# Patient Record
Sex: Male | Born: 1990
Health system: Southern US, Community
[De-identification: ages and names within clinical notes are randomized; demographics above are authoritative.]

---
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
## 2010-07-28 ENCOUNTER — Emergency Department: Payer: Self-pay | Admitting: Emergency Medicine

## 2012-07-01 ENCOUNTER — Emergency Department: Payer: Self-pay | Admitting: Emergency Medicine

## 2012-07-02 ENCOUNTER — Emergency Department: Payer: Self-pay | Admitting: Internal Medicine

## 2012-07-18 ENCOUNTER — Emergency Department: Payer: Self-pay | Admitting: Emergency Medicine

## 2014-04-23 ENCOUNTER — Emergency Department: Payer: Self-pay | Admitting: Emergency Medicine

## 2014-04-23 LAB — CBC WITH DIFFERENTIAL/PLATELET
BASOS PCT: 0.2 %
Basophil #: 0 10*3/uL (ref 0.0–0.1)
EOS PCT: 0.4 %
Eosinophil #: 0 10*3/uL (ref 0.0–0.7)
HCT: 51.7 % (ref 40.0–52.0)
HGB: 17.2 g/dL (ref 13.0–18.0)
LYMPHS ABS: 0.8 10*3/uL — AB (ref 1.0–3.6)
Lymphocyte %: 8.2 %
MCH: 29.4 pg (ref 26.0–34.0)
MCHC: 33.3 g/dL (ref 32.0–36.0)
MCV: 88 fL (ref 80–100)
MONO ABS: 1.4 x10 3/mm — AB (ref 0.2–1.0)
Monocyte %: 14.3 %
NEUTROS PCT: 76.9 %
Neutrophil #: 7.4 10*3/uL — ABNORMAL HIGH (ref 1.4–6.5)
Platelet: 204 10*3/uL (ref 150–440)
RBC: 5.86 10*6/uL (ref 4.40–5.90)
RDW: 12.5 % (ref 11.5–14.5)
WBC: 9.6 10*3/uL (ref 3.8–10.6)

## 2014-04-23 LAB — COMPREHENSIVE METABOLIC PANEL
ALBUMIN: 5 g/dL
ALK PHOS: 72 U/L
Anion Gap: 10 (ref 7–16)
BILIRUBIN TOTAL: 1.8 mg/dL — AB
BUN: 16 mg/dL
CALCIUM: 9.1 mg/dL
CO2: 25 mmol/L
Chloride: 103 mmol/L
Creatinine: 1.04 mg/dL
EGFR (African American): >60
EGFR (Non-African Amer.): >60
Glucose: 89 mg/dL
Potassium: 3.9 mmol/L
SGOT(AST): 26 U/L
SGPT (ALT): 16 U/L — ABNORMAL LOW
SODIUM: 138 mmol/L
Total Protein: 8 g/dL

## 2014-04-23 LAB — URINALYSIS, COMPLETE
BILIRUBIN, UR: NEGATIVE
Bacteria: NONE SEEN
Blood: NEGATIVE
Glucose,UR: NEGATIVE mg/dL (ref 0–75)
LEUKOCYTE ESTERASE: NEGATIVE
Nitrite: NEGATIVE
PH: 5 (ref 4.5–8.0)
Protein: 30
RBC,UR: NONE SEEN /HPF (ref 0–5)
SQUAMOUS EPITHELIAL: NONE SEEN
Specific Gravity: 1.032 (ref 1.003–1.030)
WBC UR: 2 /HPF (ref 0–5)

## 2014-04-23 LAB — LIPASE, BLOOD: Lipase: 36 U/L

## 2014-07-23 ENCOUNTER — Emergency Department: Payer: No Typology Code available for payment source

## 2014-07-23 ENCOUNTER — Emergency Department
Admission: EM | Admit: 2014-07-23 | Discharge: 2014-07-23 | Disposition: A | Discharge status: Home / Self Care | Payer: No Typology Code available for payment source | Attending: Emergency Medicine | Admitting: Emergency Medicine

## 2014-07-23 ENCOUNTER — Encounter: Payer: Self-pay | Admitting: Emergency Medicine

## 2014-07-23 DIAGNOSIS — S40012A Contusion of left shoulder, initial encounter: Secondary | ICD-10-CM

## 2014-07-23 DIAGNOSIS — S60511A Abrasion of right hand, initial encounter: Secondary | ICD-10-CM | POA: Diagnosis not present

## 2014-07-23 DIAGNOSIS — Z23 Encounter for immunization: Secondary | ICD-10-CM | POA: Insufficient documentation

## 2014-07-23 DIAGNOSIS — Y92410 Unspecified street and highway as the place of occurrence of the external cause: Secondary | ICD-10-CM | POA: Diagnosis not present

## 2014-07-23 DIAGNOSIS — Y998 Other external cause status: Secondary | ICD-10-CM | POA: Diagnosis not present

## 2014-07-23 DIAGNOSIS — S4992XA Unspecified injury of left shoulder and upper arm, initial encounter: Secondary | ICD-10-CM | POA: Diagnosis present

## 2014-07-23 DIAGNOSIS — Y9389 Activity, other specified: Secondary | ICD-10-CM | POA: Diagnosis not present

## 2014-07-23 DIAGNOSIS — S0990XA Unspecified injury of head, initial encounter: Secondary | ICD-10-CM | POA: Diagnosis not present

## 2014-07-23 MED ORDER — OXYCODONE-ACETAMINOPHEN 5-325 MG PO TABS: 1.0000 | ORAL_TABLET | ORAL | Status: DC | PRN

## 2014-07-23 MED ORDER — ONDANSETRON 4 MG PO TBDP
4.0000 mg | ORAL_TABLET | Freq: Once | ORAL | Status: AC
  Administered 2014-07-23: 4 mg via ORAL

## 2014-07-23 MED ORDER — OXYCODONE-ACETAMINOPHEN 5-325 MG PO TABS
ORAL_TABLET | ORAL | Status: AC
Start: 2014-07-23 — End: 2014-07-23
  Administered 2014-07-23: 1 via ORAL
  Filled 2014-07-23: qty 1

## 2014-07-23 MED ORDER — TETANUS-DIPHTHERIA TOXOIDS TD 5-2 LFU IM INJ
INJECTION | INTRAMUSCULAR | Status: AC
  Filled 2014-07-23: qty 0.5

## 2014-07-23 MED ORDER — IBUPROFEN 800 MG PO TABS: 800.0000 mg | ORAL_TABLET | Freq: Three times a day (TID) | ORAL | Status: DC | PRN

## 2014-07-23 MED ORDER — ONDANSETRON 4 MG PO TBDP
ORAL_TABLET | ORAL | Status: AC
  Filled 2014-07-23: qty 1

## 2014-07-23 MED ORDER — TETANUS-DIPHTH-ACELL PERTUSSIS 5-2.5-18.5 LF-MCG/0.5 IM SUSP
INTRAMUSCULAR | Status: AC
  Administered 2014-07-23: 0.5 mL via INTRAMUSCULAR
  Filled 2014-07-23: qty 0.5

## 2014-07-23 MED ORDER — DIPHTH-ACELL PERTUSSIS-TETANUS 25-58-10 LF-MCG/0.5 IM SUSP: 0.5000 mL | Freq: Once | INTRAMUSCULAR | Status: DC

## 2014-07-23 MED ORDER — OXYCODONE-ACETAMINOPHEN 5-325 MG PO TABS
1.0000 | ORAL_TABLET | Freq: Once | ORAL | Status: AC
  Administered 2014-07-23: 1 via ORAL
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
## 2014-07-23 NOTE — ED Notes (Signed)
Patient transported to CT 
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
## 2014-07-27 ENCOUNTER — Encounter: Payer: Self-pay | Admitting: General Practice

## 2014-07-27 ENCOUNTER — Emergency Department
Admission: EM | Admit: 2014-07-27 | Discharge: 2014-07-27 | Disposition: A | Discharge status: Home / Self Care | Payer: No Typology Code available for payment source | Attending: Emergency Medicine | Admitting: Emergency Medicine

## 2014-07-27 DIAGNOSIS — M25512 Pain in left shoulder: Secondary | ICD-10-CM | POA: Insufficient documentation

## 2014-07-27 DIAGNOSIS — M79602 Pain in left arm: Secondary | ICD-10-CM

## 2014-07-27 DIAGNOSIS — M545 Low back pain, unspecified: Secondary | ICD-10-CM

## 2014-07-27 MED ORDER — CYCLOBENZAPRINE HCL 10 MG PO TABS: 10.0000 mg | ORAL_TABLET | Freq: Three times a day (TID) | ORAL | Status: DC | PRN

## 2014-07-27 NOTE — ED Notes (Signed)
See provider notes for full assessment 

## 2014-07-27 NOTE — Discharge Instructions (Signed)
If you continue to have pain, you will need to follow up with the orthopedic doctor.
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
## 2015-05-28 ENCOUNTER — Encounter: Payer: Self-pay | Admitting: Emergency Medicine

## 2015-05-28 ENCOUNTER — Emergency Department
Admission: EM | Admit: 2015-05-28 | Discharge: 2015-05-28 | Disposition: A | Discharge status: Home / Self Care | Payer: No Typology Code available for payment source | Attending: Emergency Medicine | Admitting: Emergency Medicine

## 2015-05-28 DIAGNOSIS — J069 Acute upper respiratory infection, unspecified: Secondary | ICD-10-CM

## 2015-05-28 DIAGNOSIS — N390 Urinary tract infection, site not specified: Secondary | ICD-10-CM | POA: Insufficient documentation

## 2015-05-28 MED ORDER — FLUTICASONE PROPIONATE 50 MCG/ACT NA SUSP: 2.0000 | Freq: Every day | NASAL | Status: DC

## 2015-05-28 MED ORDER — CETIRIZINE HCL 5 MG PO TABS: 5.0000 mg | ORAL_TABLET | Freq: Every day | ORAL | Status: DC

## 2015-05-28 MED ORDER — AZITHROMYCIN 250 MG PO TABS: ORAL_TABLET | ORAL | Status: DC

## 2015-05-28 MED ORDER — BENZONATATE 100 MG PO CAPS: 100.0000 mg | ORAL_CAPSULE | Freq: Three times a day (TID) | ORAL | Status: DC | PRN

## 2015-05-28 NOTE — ED Notes (Signed)
Discussed discharge instructions, prescriptions, and follow-up care with patient. No questions or concerns at this time. Pt stable at discharge.  

## 2015-05-28 NOTE — ED Provider Notes (Signed)
[facility] Emergency Department Provider Note ____________________________________________  Time seen: 0719  I have reviewed the triage vital signs and the nursing notes.  HISTORY  Chief Complaint  Cough and Sore Throat  HPI Alexander Baird is a 25 y.o. male presents to the ED for evaluation of 3 day history of cough, sore throat, and intermittent fevers. The patient reports taking an over-the-counter cough and cold medicine yesterday, and today presents to the ED without any medications on board.He also notes subjective fevers, chills, and productive cough. He describes central chest wall pain and tightness associated with his cough. He did not receive the seasonal flu vaccine.   History reviewed. No pertinent past medical history.  There are no active problems to display for this patient.   History reviewed. No pertinent past surgical history.  Current Outpatient Rx  Name  Route  Sig  Dispense  Refill  . azithromycin (ZITHROMAX Z-PAK) 250 MG tablet      Take 2 tablets (500 mg) on  Day 1,  followed by 1 tablet (250 mg) once daily on Days 2 through 5.   6 each   0   . benzonatate (TESSALON PERLES) 100 MG capsule   Oral   Take 1 capsule (100 mg total) by mouth 3 (three) times daily as needed for cough (Take 1-2 per dose).   30 capsule   0   . cetirizine (ZYRTEC) 5 MG tablet   Oral   Take 1 tablet (5 mg total) by mouth daily.   30 tablet   0   . cyclobenzaprine (FLEXERIL) 10 MG tablet   Oral   Take 1 tablet (10 mg total) by mouth 3 (three) times daily as needed for muscle spasms.   30 tablet   0   . fluticasone (FLONASE) 50 MCG/ACT nasal spray   Each Nare   Place 2 sprays into both nostrils daily.   16 g   0   . ibuprofen (ADVIL,MOTRIN) 800 MG tablet   Oral   Take 1 tablet (800 mg total) by mouth every 8 (eight) hours as needed for moderate pain.   15 tablet   0   . oxyCODONE-acetaminophen (ROXICET) 5-325 MG per tablet   Oral   Take 1  tablet by mouth every 4 (four) hours as needed for severe pain.   15 tablet   0    Allergies Review of patient's allergies indicates no known allergies.  History reviewed. No pertinent family history.  Social History Social History  Substance Use Topics  . Smoking status: Never Smoker   . Smokeless tobacco: None  . Alcohol Use: No   Review of Systems  Constitutional: Positive for subjective fevers and bodyaches. Eyes: Negative for visual changes. ENT: Positive for sore throat. Reports nasal congestion Cardiovascular: Negative for chest pain. Respiratory: Negative for shortness of breath. Reports productive cough Gastrointestinal: Negative for abdominal pain, vomiting and diarrhea. Musculoskeletal: Negative for back pain. Skin: Negative for rash. ____________________________________________  PHYSICAL EXAM:  VITAL SIGNS: ED Triage Vitals  Enc Vitals Group     BP 05/28/15 0708 113/72 mmHg     Pulse Rate 05/28/15 0708 90     Resp 05/28/15 0708 18     Temp 05/28/15 0708 98.8 F (37.1 C)     Temp Source 05/28/15 0708 Oral     SpO2 05/28/15 0708 97 %     Weight 05/28/15 0708 160 lb (72.576 kg)     Height 05/28/15 0708  (1.88 m)  Head Cir --      Peak Flow --      Pain Score --      Pain Loc --      Pain Edu? --      Excl. in GC? --    Constitutional: Alert and oriented. Well appearing and in no distress. Head: Normocephalic and atraumatic.      Eyes: Conjunctivae are normal. PERRL. Normal extraocular movements      Ears: Canals clear. TMs intact bilaterally.   Nose: No congestion/rhinorrhea. Mild maxillary sinus tenderness on palpation.   Mouth/Throat: Mucous membranes are moist. Uvula midline. Tonsils flat, without erythema or exudates   Neck: Supple. No thyromegaly. Hematological/Lymphatic/Immunological: No cervical lymphadenopathy. Cardiovascular: Normal rate, regular rhythm.  Respiratory: Normal respiratory effort. No wheezes/rales/rhonchi.  Intermittent cough during exam. Gastrointestinal: Soft and nontender. No distention. Musculoskeletal: Nontender with normal range of motion in all extremities.  Neurologic:  Normal gait without ataxia. Normal speech and language. No gross focal neurologic deficits are appreciated. Skin:  Skin is warm, dry and intact. No rash noted. ____________________________________________  INITIAL IMPRESSION / ASSESSMENT AND PLAN / ED COURSE  Patient with symptoms likely representing a viral URI. He will be discharged with prescriptions for Flonase, cetirizine, Tessalon Perles, and azithromycin. He is encouraged to hold the antibiotic until, or if, symptoms worsen. He will follow-up with one of the local community clinics for ongoing symptom management.  ____________________________________________  FINAL CLINICAL IMPRESSION(S) / ED DIAGNOSES  Final diagnoses:  URI (upper respiratory infection)     Lissa HoardJenise V Bacon Cameryn Schum, PA-C 05/28/15 [id number]  Governor Rooksebecca Lord, MD 05/28/15 1337

## 2015-05-28 NOTE — Discharge Instructions (Signed)
Upper Respiratory Infection, Adult Most upper respiratory infections (URIs) are a viral infection of the air passages leading to the lungs. A URI affects the nose, throat, and upper air passages. The most common type of URI is nasopharyngitis and is typically referred to as "the common cold." URIs run their course and usually go away on their own. Most of the time, a URI does not require medical attention, but sometimes a bacterial infection in the upper airways can follow a viral infection. This is called a secondary infection. Sinus and middle ear infections are common types of secondary upper respiratory infections. Bacterial pneumonia can also complicate a URI. A URI can worsen asthma and chronic obstructive pulmonary disease (COPD). Sometimes, these complications can require emergency medical care and may be life threatening.  CAUSES Almost all URIs are caused by viruses. A virus is a type of germ and can spread from one person to another.  RISKS FACTORS You may be at risk for a URI if:   You smoke.   You have chronic heart or lung disease.  You have a weakened defense (immune) system.   You are very young or very old.   You have nasal allergies or asthma.  You work in crowded or poorly ventilated areas.  You work in health care facilities or schools. SIGNS AND SYMPTOMS  Symptoms typically develop 2-3 days after you come in contact with a cold virus. Most viral URIs last 7-10 days. However, viral URIs from the influenza virus (flu virus) can last 14-18 days and are typically more severe. Symptoms may include:   Runny or stuffy (congested) nose.   Sneezing.   Cough.   Sore throat.   Headache.   Fatigue.   Fever.   Loss of appetite.   Pain in your forehead, behind your eyes, and over your cheekbones (sinus pain).  Muscle aches.  DIAGNOSIS  Your health care provider may diagnose a URI by:  Physical exam.  Tests to check that your symptoms are not due to  another condition such as:  Strep throat.  Sinusitis.  Pneumonia.  Asthma. TREATMENT  A URI goes away on its own with time. It cannot be cured with medicines, but medicines may be prescribed or recommended to relieve symptoms. Medicines may help:  Reduce your fever.  Reduce your cough.  Relieve nasal congestion. HOME CARE INSTRUCTIONS   Take medicines only as directed by your health care provider.   Gargle warm saltwater or take cough drops to comfort your throat as directed by your health care provider.  Use a warm mist humidifier or inhale steam from a shower to increase air moisture. This may make it easier to breathe.  Drink enough fluid to keep your urine clear or pale yellow.   Eat soups and other clear broths and maintain good nutrition.   Rest as needed.   Return to work when your temperature has returned to normal or as your health care provider advises. You may need to stay home longer to avoid infecting others. You can also use a face mask and careful hand washing to prevent spread of the virus.  Increase the usage of your inhaler if you have asthma.   Do not use any tobacco products, including cigarettes, chewing tobacco, or electronic cigarettes. If you need help quitting, ask your health care provider. PREVENTION  The best way to protect yourself from getting a cold is to practice good hygiene.   Avoid oral or hand contact with people with cold  symptoms.   Wash your hands often if contact occurs.  There is no clear evidence that vitamin C, vitamin E, echinacea, or exercise reduces the chance of developing a cold. However, it is always recommended to get plenty of rest, exercise, and practice good nutrition.  SEEK MEDICAL CARE IF:   You are getting worse rather than better.   Your symptoms are not controlled by medicine.   You have chills.  You have worsening shortness of breath.  You have brown or red mucus.  You have yellow or brown nasal  discharge.  You have pain in your face, especially when you bend forward.  You have a fever.  You have swollen neck glands.  You have pain while swallowing.  You have white areas in the back of your throat. SEEK IMMEDIATE MEDICAL CARE IF:   You have severe or persistent:  Headache.  Ear pain.  Sinus pain.  Chest pain.  You have chronic lung disease and any of the following:  Wheezing.  Prolonged cough.  Coughing up blood.  A change in your usual mucus.  You have a stiff neck.  You have changes in your:  Vision.  Hearing.  Thinking.  Mood. MAKE SURE YOU:   Understand these instructions.  Will watch your condition.  Will get help right away if you are not doing well or get worse.   This information is not intended to replace advice given to you by your health care provider. Make sure you discuss any questions you have with your health care provider.   Document Released: 07/11/2000 Document Revised: 06/01/2014 Document Reviewed: 04/22/2013 Elsevier Interactive Patient Education [employer]2016 [employer].   Your exam is consistent with an upper respiratory infection, it is likely viral at this time. Take the prescription meds for cough, congestion, and nasal drainage. Hold the antibiotic and only dose if symptoms worsen within the next week. If you start the antibiotic, you must take it until completed. Increase fluid intake, and consider using an OTC Mucinex for chest congestion. Follow-up with one of the local community clinics for continued symptoms.

## 2015-05-28 NOTE — ED Notes (Signed)
Pt presents to ED c/o cough, sore throat and fevers at home x3 days. Pt states they took cold medicine yesterday. Afebrile at triage, no antipyretics taken today per pt.

## 2016-01-08 ENCOUNTER — Emergency Department
Admission: EM | Admit: 2016-01-08 | Discharge: 2016-01-08 | Disposition: A | Discharge status: Home / Self Care | Payer: No Typology Code available for payment source | Attending: Emergency Medicine | Admitting: Emergency Medicine

## 2016-01-08 DIAGNOSIS — M545 Low back pain, unspecified: Secondary | ICD-10-CM

## 2016-01-08 DIAGNOSIS — Z79899 Other long term (current) drug therapy: Secondary | ICD-10-CM | POA: Insufficient documentation

## 2016-01-08 DIAGNOSIS — Y999 Unspecified external cause status: Secondary | ICD-10-CM | POA: Diagnosis not present

## 2016-01-08 DIAGNOSIS — Y92410 Unspecified street and highway as the place of occurrence of the external cause: Secondary | ICD-10-CM | POA: Insufficient documentation

## 2016-01-08 DIAGNOSIS — Y9389 Activity, other specified: Secondary | ICD-10-CM | POA: Diagnosis not present

## 2016-01-08 DIAGNOSIS — Z791 Long term (current) use of non-steroidal anti-inflammatories (NSAID): Secondary | ICD-10-CM | POA: Diagnosis not present

## 2016-01-08 DIAGNOSIS — S3992XA Unspecified injury of lower back, initial encounter: Secondary | ICD-10-CM | POA: Diagnosis present

## 2016-01-08 MED ORDER — NAPROXEN 500 MG PO TABS: 500.0000 mg | ORAL_TABLET | Freq: Two times a day (BID) | ORAL | 2 refills | Status: DC

## 2016-01-08 MED ORDER — NAPROXEN 500 MG PO TABS
500.0000 mg | ORAL_TABLET | Freq: Once | ORAL | Status: AC
  Administered 2016-01-08: 500 mg via ORAL
  Filled 2016-01-08: qty 1
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
## 2016-10-01 ENCOUNTER — Emergency Department
Admission: EM | Admit: 2016-10-01 | Discharge: 2016-10-01 | Disposition: A | Discharge status: Home / Self Care | Payer: Self-pay | Attending: Emergency Medicine | Admitting: Emergency Medicine

## 2016-10-01 DIAGNOSIS — T1591XA Foreign body on external eye, part unspecified, right eye, initial encounter: Secondary | ICD-10-CM

## 2016-10-01 DIAGNOSIS — Y929 Unspecified place or not applicable: Secondary | ICD-10-CM | POA: Insufficient documentation

## 2016-10-01 DIAGNOSIS — Y999 Unspecified external cause status: Secondary | ICD-10-CM | POA: Insufficient documentation

## 2016-10-01 DIAGNOSIS — Y93H9 Activity, other involving exterior property and land maintenance, building and construction: Secondary | ICD-10-CM | POA: Insufficient documentation

## 2016-10-01 DIAGNOSIS — T1501XA Foreign body in cornea, right eye, initial encounter: Secondary | ICD-10-CM | POA: Insufficient documentation

## 2016-10-01 DIAGNOSIS — W208XXA Other cause of strike by thrown, projected or falling object, initial encounter: Secondary | ICD-10-CM | POA: Insufficient documentation

## 2016-10-01 MED ORDER — FLUORESCEIN SODIUM 0.6 MG OP STRP
1.0000 | ORAL_STRIP | Freq: Once | OPHTHALMIC | Status: AC
  Administered 2016-10-01: 1 via OPHTHALMIC
  Filled 2016-10-01: qty 1

## 2016-10-01 MED ORDER — ERYTHROMYCIN 5 MG/GM OP OINT
TOPICAL_OINTMENT | Freq: Once | OPHTHALMIC | Status: AC
  Administered 2016-10-01: 1 via OPHTHALMIC
  Filled 2016-10-01: qty 1

## 2016-10-01 MED ORDER — KETOROLAC TROMETHAMINE 0.5 % OP SOLN
1.0000 [drp] | Freq: Four times a day (QID) | OPHTHALMIC | Status: DC
  Administered 2016-10-01: 1 [drp] via OPHTHALMIC
  Filled 2016-10-01: qty 3

## 2016-10-01 MED ORDER — ERYTHROMYCIN 5 MG/GM OP OINT: 1.0000 "application " | TOPICAL_OINTMENT | Freq: Four times a day (QID) | OPHTHALMIC | 0 refills | Status: DC

## 2016-10-01 MED ORDER — TETRACAINE HCL 0.5 % OP SOLN
2.0000 [drp] | Freq: Once | OPHTHALMIC | Status: AC
  Administered 2016-10-01: 2 [drp] via OPHTHALMIC
  Filled 2016-10-01: qty 4

## 2016-10-01 MED ORDER — KETOROLAC TROMETHAMINE 0.5 % OP SOLN: 1.0000 [drp] | Freq: Four times a day (QID) | OPHTHALMIC | 0 refills | Status: DC

## 2016-10-01 NOTE — Discharge Instructions (Signed)
Call and schedule an appointment with [facility]. Use the ointment and drops as prescribed. Return to the ER for symptoms that change or worsen if unable to schedule an appointment.

## 2016-10-01 NOTE — ED Triage Notes (Signed)
Reports he was cutting bolt off car and thinks there is something in his right eye.

## 2016-10-01 NOTE — ED Notes (Signed)
Pharmacy emailed to send acular

## 2016-10-01 NOTE — ED Provider Notes (Signed)
[facility] Emergency Department Provider Note ____________________________________________  Time seen: Approximately 9:11 PM  I have reviewed the triage vital signs and the nursing notes.   HISTORY  Chief Complaint Foreign Body in Eye   HPI Alexander Baird is a 26 y.o. male who presents to the emergency department for evaluation of right eye pain. Yesterday while cutting a bolt off of a car, he feels like something got into his eye. He states that he has attempted to rinse it out and has used over-the-counter eyedrops without any relief. Vision is unchanged with the exception of some photophobia and clear watery discharge.  No past medical history on file.  There are no active problems to display for this patient.   No past surgical history on file.  Prior to Admission medications   Medication Sig Start Date End Date Taking? Authorizing Provider  azithromycin (ZITHROMAX Z-PAK) 250 MG tablet Take 2 tablets (500 mg) on  Day 1,  followed by 1 tablet (250 mg) once daily on Days 2 through 5. 05/28/15   Menshew, Charlesetta Ivory, PA-C  benzonatate (TESSALON PERLES) 100 MG capsule Take 1 capsule (100 mg total) by mouth 3 (three) times daily as needed for cough (Take 1-2 per dose). 05/28/15   Menshew, Charlesetta Ivory, PA-C  cetirizine (ZYRTEC) 5 MG tablet Take 1 tablet (5 mg total) by mouth daily. 05/28/15   Menshew, Charlesetta Ivory, PA-C  cyclobenzaprine (FLEXERIL) 10 MG tablet Take 1 tablet (10 mg total) by mouth 3 (three) times daily as needed for muscle spasms. 07/27/14   Annaliah Rivenbark, Rulon Eisenmenger B, FNP  erythromycin ophthalmic ointment Place 1 application into the right eye 4 (four) times daily. Put 1/2 inch ribbon in bottom lid 4 times per day 10/01/16   Kem Boroughs B, FNP  fluticasone (FLONASE) 50 MCG/ACT nasal spray Place 2 sprays into both nostrils daily. 05/28/15   Menshew, Charlesetta Ivory, PA-C  ibuprofen (ADVIL,MOTRIN) 800 MG tablet Take 1 tablet (800 mg total) by mouth every  8 (eight) hours as needed for moderate pain. 07/23/14   Irean Hong, MD  ketorolac (ACULAR) 0.5 % ophthalmic solution Place 1 drop into the right eye 4 (four) times daily. 10/01/16   Dorothee Napierkowski, Rulon Eisenmenger B, FNP  naproxen (NAPROSYN) 500 MG tablet Take 1 tablet (500 mg total) by mouth 2 (two) times daily with a meal. 01/08/16   Jene Every, MD  oxyCODONE-acetaminophen (ROXICET) 5-325 MG per tablet Take 1 tablet by mouth every 4 (four) hours as needed for severe pain. 07/23/14   Irean Hong, MD    Allergies Patient has no known allergies.  No family history on file.  Social History Social History  Substance Use Topics  . Smoking status: Never Smoker  . Smokeless tobacco: Not on file  . Alcohol use No    Review of Systems   Constitutional: No fever/chills Eyes: Negative for visual changes. Positive for pain. Musculoskeletal: Negative for pain. Skin: Negative for rash. Neurological: Negative for headaches, focal weakness or numbness. Allergic: Negative for seasonal allergies. ____________________________________________  PHYSICAL EXAM:  VITAL SIGNS: ED Triage Vitals  Enc Vitals Group     BP 10/01/16 2057 122/69     Pulse Rate 10/01/16 2057 72     Resp 10/01/16 2057 20     Temp 10/01/16 2057 98.4 F (36.9 C)     Temp Source 10/01/16 2057 Oral     SpO2 10/01/16 2057 99 %     Weight 10/01/16 2055 160 lb (  72.6 kg)     Height 10/01/16 2055 6\' 3"  (1.905 m)     Head Circumference --      Peak Flow --      Pain Score --      Pain Loc --      Pain Edu? --      Excl. in GC? --     Constitutional: Alert and oriented. Well appearing and in no acute distress. Eyes: Visual acuity--see nursing documentation; No globe trauma; Eyelids normal to inspection; Sclera appears anicteric.  Eyelids not inverted. Right conjunctiva appears erythematous; R cornea with 2 black specks visualized on exam with Wood's lamp magnification. Head: Atraumatic. Nose: No congestion/rhinnorhea. Mouth/Throat:  Mucous membranes are moist.  Oropharynx non-erythematous. Respiratory: Respirations even and unlabored. Musculoskeletal:Normal ROM x 4 extremities. Neurologic:  Normal speech and language. No gross focal neurologic deficits are appreciated. Speech is normal. No gait instability. Skin:  Skin is warm, dry and intact. No rash noted. Psychiatric: Mood and affect are normal. Speech and behavior are normal.  ____________________________________________   LABS (all labs ordered are listed, but only abnormal results are displayed)  Labs Reviewed - No data to display ____________________________________________  EKG  Not indicated ____________________________________________  RADIOLOGY  Not indicated ____________________________________________   PROCEDURES  Procedure(s) performed:   Tetracaine drops instilled into the right eye, then visual exam with Wood's lamp. Sterile Q-tip rolled over the 2 foreign bodies with successful removal of one, but not the other. Patient unable to keep his eye still and continued to complain of significant pain so two additional drops of tetracaine were instilled and about 5 minutes later a second attempt was made to remove the second foreign body. Again, patient was unable to keep his eye still enough to even consider using the bevel of edge of a needle to flick the foreign body out. ____________________________________________   INITIAL IMPRESSION / ASSESSMENT AND PLAN / ED COURSE  26 year old male presents to the emergency department for treatment and evaluation of right eye pain. 2 foreign bodies were identified on Woods lamp exam. One was successfully removed with sterile cotton swab. The other was not removed due to patient's inability to tolerate additional procedures. Patient became excessively angry and demanding that I use something to get the remaining foreign body out of his eye. I attempted multiple times to explain to the patient that it would  be dangerous to attempt using anything in his eye with the exception of the cotton swab due to his intolerance. I advised him that I would cover him with erythromycin ointment and give him drops for pain, then have him see the ophthalmologist tomorrow. Patient decided to leave without his erythromycin ointment or Acular therefore opthalmology was not called.  Pertinent labs & imaging results that were available during my care of the patient were reviewed by me and considered in my medical decision making (see chart for details). ____________________________________________   FINAL CLINICAL IMPRESSION(S) / ED DIAGNOSES  Final diagnoses:  Foreign body, eye, right, initial encounter    Note:  This document was prepared using Dragon voice recognition software and may include unintentional dictation errors.    Chinita Pesterriplett, Micco Bourbeau B, FNP 10/01/16 2257    Minna AntisPaduchowski, Kevin, MD 10/01/16 2330

## 2016-10-01 NOTE — ED Notes (Signed)
Provider attempted to remove metal from pt's eye multiple times without success d/t pt unable to hold eye still - when this nurse went to discharge pt he became agitated that metal was not removed from eye and refused the oint and the eye gtts that had been prescribed to him - he became very loud and demanding in the hall and requested to see another provider - this nurse called for provider who explained to pt that she was unable to remove metal from his eye and that the medications rx'd would help - pt refused to go back into room and receive medication and stated he was not leaving the ED until metal was removed - provider and this nurse explained to pt that he could option one take the medications and let his eye heal or option 2 he could leave the ED without further tx which could damage his eye - pt became loud and stated that he was not leaving - this nurse advised Myra ED Tech to call for security to escort pt out of ED - pt then stated "I'm fucking leaving and suing this hospital" - pt left the ED without discharge instructions, eye oint, and eye gtts

## 2017-03-01 ENCOUNTER — Ambulatory Visit (INDEPENDENT_AMBULATORY_CARE_PROVIDER_SITE_OTHER): Payer: Self-pay | Admitting: Pharmacist

## 2017-03-01 DIAGNOSIS — Z7252 High risk homosexual behavior: Secondary | ICD-10-CM

## 2017-03-01 NOTE — Progress Notes (Signed)
[facility] Pharmacy Visit  HPI: Damauri Minion is a 27 y.o. male who presents to the [facility] pharmacy clinic for PrEP initiation.   There are no active problems to display for this patient.   Patient's Medications  New Prescriptions   No medications on file  Previous Medications   No medications on file  Modified Medications   No medications on file  Discontinued Medications   AZITHROMYCIN (ZITHROMAX Z-PAK) 250 MG TABLET    Take 2 tablets (500 mg) on  Day 1,  followed by 1 tablet (250 mg) once daily on Days 2 through 5.   BENZONATATE (TESSALON PERLES) 100 MG CAPSULE    Take 1 capsule (100 mg total) by mouth 3 (three) times daily as needed for cough (Take 1-2 per dose).   CETIRIZINE (ZYRTEC) 5 MG TABLET    Take 1 tablet (5 mg total) by mouth daily.   CYCLOBENZAPRINE (FLEXERIL) 10 MG TABLET    Take 1 tablet (10 mg total) by mouth 3 (three) times daily as needed for muscle spasms.   ERYTHROMYCIN OPHTHALMIC OINTMENT    Place 1 application into the right eye 4 (four) times daily. Put 1/2 inch ribbon in bottom lid 4 times per day   FLUTICASONE (FLONASE) 50 MCG/ACT NASAL SPRAY    Place 2 sprays into both nostrils daily.   IBUPROFEN (ADVIL,MOTRIN) 800 MG TABLET    Take 1 tablet (800 mg total) by mouth every 8 (eight) hours as needed for moderate pain.   KETOROLAC (ACULAR) 0.5 % OPHTHALMIC SOLUTION    Place 1 drop into the right eye 4 (four) times daily.   NAPROXEN (NAPROSYN) 500 MG TABLET    Take 1 tablet (500 mg total) by mouth 2 (two) times daily with a meal.   OXYCODONE-ACETAMINOPHEN (ROXICET) 5-325 MG PER TABLET    Take 1 tablet by mouth every 4 (four) hours as needed for severe pain.    Allergies: No Known Allergies  Past Medical History: No past medical history on file.  Social History: Social History   Socioeconomic History  . Marital status: Single    Spouse name: Not on file  . Number of children: Not on file  . Years of education: Not on file  .  Highest education level: Not on file  Social Needs  . Financial resource strain: Not on file  . Food insecurity - worry: Not on file  . Food insecurity - inability: Not on file  . Transportation needs - medical: Not on file  . Transportation needs - non-medical: Not on file  Occupational History  . Not on file  Tobacco Use  . Smoking status: Never Smoker  Substance and Sexual Activity  . Alcohol use: No  . Drug use: No  . Sexual activity: No  Other Topics Concern  . Not on file  Social History Narrative  . Not on file    Labs: No results found for: HIV1RNAQUANT, HIV1RNAVL, CD4TABS, HEPBSAB, HEPBSAG, HCVAB  HIV Pre-Exposure Prophylaxis (PrEP): Patient's risk for HIV: MSM Sexual partner preference: male Number of sexual partners in the last 3 months: 4 Condom use? Yes - only 25% Sexual activity preference: insertive Symptoms of acute HIV? no Last date of BMET: none Last date of STI testing: last month (unknown) Last negative HIV antibody test: last month sometime  Assessment: Yonathan is here today to discuss and initiate PrEP.  He heard about our program through [facility]. He is currently uninsured, so I will help him  get medication through [employer] patient assistance program. He knows a little bit about Truvada at baseline, but I spent time discussing what Truvada is and how it works including prevention for HIV.  I discussed the need to take it each day and not just before sexual encounters. I also told him about possible side effects he could encounter during Truvada but that he probably will not have any side effects at all.  Aayush has had 4 partners in the last 3-6 months. His 4 partners that he does have are very stable.  He states he often does go to the club and meet new people but he is very particular about his partners.  He states his 4 partners are "clean" and are checked regularly.  He thinks they are on PrEP.  I told him to tell them to come to our  clinic if they are not on PrEP. He says that none of them are HIV positive.  He states he is always the insertive partner and never engages in receptive anal intercourse and he does not perform oral sex either.  He uses condoms only with one out of four partners, so 25%. I spent time discussing the need to use condoms to prevent STD transmission and help further protect against HIV.  He has never been tested positive for a STD and states he gets testing regularly.    I will do baseline urine g/c testing, RPR, BMET, Hep B serologies, and HIV antibody.  He just started a new job and does not have any pay stubs yet so I will write a no pay stub letter to [employer].  He should be approved with no problem.  If he is HIV negative, [facility] will [facility] when [facility] is approved and he will come back in 1 month to see me again.  Plan: - HIV antibody - RPR, urine G/C, Hep B serologies, BMET - Will send in application to [employer] when labs come back - F/u with me again 2/27 at 10am  Derryl Uher L. Chelsia Serres, PharmD, AAHIVP, CPP Infectious Diseases Clinical Pharmacist [facility] 03/01/2017, 1:47 PM

## 2017-03-04 ENCOUNTER — Telehealth: Payer: Self-pay | Admitting: Pharmacist

## 2017-03-04 ENCOUNTER — Other Ambulatory Visit: Payer: Self-pay | Admitting: Pharmacist

## 2017-03-04 DIAGNOSIS — Z7252 High risk homosexual behavior: Secondary | ICD-10-CM

## 2017-03-04 LAB — BASIC METABOLIC PANEL
BUN: 15 mg/dL (ref 7–25)
CHLORIDE: 104 mmol/L (ref 98–110)
CO2: 29 mmol/L (ref 20–32)
CREATININE: 0.92 mg/dL (ref 0.60–1.35)
Calcium: 9.5 mg/dL (ref 8.6–10.3)
Glucose, Bld: 81 mg/dL (ref 65–99)
POTASSIUM: 4.3 mmol/L (ref 3.5–5.3)
SODIUM: 141 mmol/L (ref 135–146)

## 2017-03-04 LAB — RPR: RPR: NONREACTIVE

## 2017-03-04 LAB — HIV ANTIBODY (ROUTINE TESTING W REFLEX): HIV 1&2 Ab, 4th Generation: NONREACTIVE

## 2017-03-04 LAB — HEPATITIS B SURFACE ANTIGEN: HEP B S AG: NONREACTIVE

## 2017-03-04 LAB — URINE CYTOLOGY ANCILLARY ONLY
CHLAMYDIA, DNA PROBE: NEGATIVE
NEISSERIA GONORRHEA: NEGATIVE

## 2017-03-04 LAB — HEPATITIS B SURFACE ANTIBODY,QUALITATIVE: Hep B S Ab: NONREACTIVE

## 2017-03-04 MED ORDER — EMTRICITABINE-TENOFOVIR DF 200-300 MG PO TABS: 1.0000 | ORAL_TABLET | Freq: Every day | ORAL | 0 refills | Status: DC

## 2017-03-04 NOTE — Telephone Encounter (Signed)
Called [place] to let him know that his HIV antibody was negative.  His STI tests are still in process. [facility] this morning. Will let him know when he is approved.

## 2017-03-04 NOTE — Telephone Encounter (Signed)
Perfect`1

## 2017-03-07 ENCOUNTER — Other Ambulatory Visit: Payer: Self-pay | Admitting: Pharmacist

## 2017-03-07 ENCOUNTER — Telehealth: Payer: Self-pay | Admitting: Pharmacist

## 2017-03-07 DIAGNOSIS — Z7252 High risk homosexual behavior: Secondary | ICD-10-CM

## 2017-03-07 MED ORDER — EMTRICITABINE-TENOFOVIR DF 200-300 MG PO TABS: 1.0000 | ORAL_TABLET | Freq: Every day | ORAL | 0 refills | Status: DC

## 2017-03-07 MED FILL — TRUVADA 200-300 MG TABS: 200-300 | 30 days supply | Qty: 30 | Fill #0

## 2017-03-07 NOTE — Telephone Encounter (Signed)
Fantastic!  ?

## 2017-03-07 NOTE — Telephone Encounter (Signed)
Alexander Baird is approved for Truvada through [employer] patient assistance program until 01/28/2018.  I relayed information to him and we will mail it from [facility].

## 2017-03-15 ENCOUNTER — Telehealth: Payer: Self-pay

## 2017-03-15 NOTE — Telephone Encounter (Signed)
Pt called; verified by name and DOB. Pt reported side effects as a result of taking brand Truvada daily as prescribed. Note to Rx Team - please call pt back on phone number in chart. Thank you.

## 2017-03-18 ENCOUNTER — Telehealth: Payer: Self-pay | Admitting: Pharmacist Clinician (PhC)/ Clinical Pharmacy Specialist

## 2017-03-18 ENCOUNTER — Telehealth: Payer: Self-pay | Admitting: Pharmacist

## 2017-03-18 NOTE — Telephone Encounter (Signed)
Received note that Alexander Baird called last Friday with reported side effects to Truvada.  Called him this morning and had to leave a message to call me back.

## 2017-03-18 NOTE — Telephone Encounter (Signed)
Alexander Baird called back to state that he has some stomach upset with Truvada and some headaches. Told him to take take some PRN apap or ibuprofen for headaches and pick up some generic peptobismo the stomach upset. It should goes away quickly.

## 2017-03-27 ENCOUNTER — Ambulatory Visit: Payer: Self-pay

## 2017-04-01 ENCOUNTER — Ambulatory Visit: Payer: Self-pay

## 2017-04-02 ENCOUNTER — Ambulatory Visit (INDEPENDENT_AMBULATORY_CARE_PROVIDER_SITE_OTHER): Payer: Self-pay | Admitting: Pharmacist

## 2017-04-02 DIAGNOSIS — Z7252 High risk homosexual behavior: Secondary | ICD-10-CM

## 2017-04-02 NOTE — Progress Notes (Signed)
[facility] Pharm[facility] Visit  HPI: Berneice GandyFields Goh is a 27 y.o. male who presents to the [facility] pharmacy clinic for his 1 month PrEP follow-up.  There are no active problems to display for this patient.   Patient's Medications  New Prescriptions   No medications on file  Previous Medications   EMTRICITABINE-TENOFOVIR (TRUVADA) 200-300 MG TABLET    Take 1 tablet by mouth daily.  Modified Medications   No medications on file  Discontinued Medications   No medications on file    Allergies: No Known Allergies  Past Medical History: No past medical history on file.  Social History: Social History   Socioeconomic History  . Marital status: Single    Spouse name: Not on file  . Number of children: Not on file  . Years of education: Not on file  . Highest education level: Not on file  Social Needs  . Financial resource strain: Not on file  . Food insecurity - worry: Not on file  . Food insecurity - inability: Not on file  . Transportation needs - medical: Not on file  . Transportation needs - non-medical: Not on file  Occupational History  . Not on file  Tobacco Use  . Smoking status: Never Smoker  Substance and Sexual Activity  . Alcohol use: No  . Drug use: No  . Sexual activity: No  Other Topics Concern  . Not on file  Social History Narrative  . Not on file    Labs: Hep B S Ab (no units)  Date Value  03/01/2017 NON-REACTIVE   Hepatitis B Surface Ag (no units)  Date Value  03/01/2017 NON-REACTIVE    HIV Pre-Exposure Prophylaxis (PrEP): Patient's risk for HIV: MSM Sexual partner preference: males Number of sexual partners in the last 3 months: 5 Condom use? Sometimes (mostly no) Sexual activity preference: insertive (top) Symptoms of acute HIV? None but does complain of sore throat Last date of BMET: 03/01/17 Last date of STI testing: 03/01/17 Last negative HIV antibody test: 03/01/17  Assessment: Jkai is here today to follow-up  for PrEP. He started Truvada ~1 month ago.  He did have some side effects of headache, joint pain, and GI upset when he first started it but those have since resolved. I explained that it takes the body a few weeks to adjust to the medication. He has taken it every day and missed no doses since starting.  He has had 3 partners since I last saw him a month ago.  Two of the same partners he always has and one new one.  He only had oral sex with the new partner.  No s/sx of STIs. He does complain of a sore throat but states it is getting better and he thinks it is viral. He is using lozenges to help with the pain. No other s/sx of HIV at this time. No fevers or rashes.  No condom use in the last month. Went over risk reduction strategies again with him including condom use. He is aware. Will only get a HIV antibody today and send in 3 months of Truvada if negative.  Plan: - HIV antibody - Truvada x 3 months if negative - F/u with me again 6/3 at 330pm  Cassie L. Kuppelweiser, PharmD, AAHIVP, CPP Infectious Diseases Clinical Pharmacist [facility] 04/02/2017, 11:40 AM

## 2017-04-03 ENCOUNTER — Telehealth: Payer: Self-pay | Admitting: Pharmacist

## 2017-04-03 ENCOUNTER — Other Ambulatory Visit: Payer: Self-pay | Admitting: Pharmacist

## 2017-04-03 DIAGNOSIS — Z7252 High risk homosexual behavior: Secondary | ICD-10-CM

## 2017-04-03 LAB — HIV ANTIBODY (ROUTINE TESTING W REFLEX): HIV: NONREACTIVE

## 2017-04-03 MED ORDER — EMTRICITABINE-TENOFOVIR DF 200-300 MG PO TABS: 1.0000 | ORAL_TABLET | Freq: Every day | ORAL | 2 refills | Status: DC

## 2017-04-03 MED FILL — TRUVADA 200-300 MG TABS: 200-300 | 30 days supply | Qty: 30 | Fill #0

## 2017-04-03 NOTE — Telephone Encounter (Signed)
Perfect

## 2017-04-03 NOTE — Telephone Encounter (Signed)
Called Courtez to let him know that his HIV antibody was negative from yesterday.  [facility].

## 2017-04-29 MED FILL — TRUVADA 200-300 MG TABS: 200-300 | 30 days supply | Qty: 30 | Fill #1

## 2017-05-23 MED FILL — TRUVADA 200-300 MG TABS: 200-300 | 30 days supply | Qty: 30 | Fill #2

## 2017-07-01 ENCOUNTER — Ambulatory Visit (INDEPENDENT_AMBULATORY_CARE_PROVIDER_SITE_OTHER): Payer: Self-pay | Admitting: Pharmacist

## 2017-07-01 DIAGNOSIS — Z7252 High risk homosexual behavior: Secondary | ICD-10-CM

## 2017-07-01 NOTE — Progress Notes (Signed)
   Date:  07/01/2017   Insured   []    Uninsured  [x]    HPI  Alexander Baird is a 27 y.o. male who presents for 3 month PREP follow-up.   Demographics Race:  White or Caucasian [1] Marital Status:  Single  Allergies No Known Allergies  No past medical history on file.  Outpatient Encounter Medications as of 07/01/2017  Medication Sig  . emtricitabine-tenofovir (TRUVADA) 200-300 MG tablet Take 1 tablet by mouth daily.   No facility-administered encounter medications on file as of 07/01/2017.    Social History   Tobacco Use  Smoking Status Never Smoker   Social History   Substance and Sexual Activity  Alcohol Use No   Drug use?   Yes []  No [x]   Injectable drug use?   Yes []     No [x]   Sexual History  Missing doses? Yes []    No  [x]   CHL HIV PREP FLOWSHEET RESULTS 03/01/2017 03/01/2017  Insurance Status - Uninsured  How did you hear? [facility] -  Gender at birth Male Male  Gender identity - cis-Male  SexPro Score 9 -  Risk for HIV - Condomless vaginal or anal intercourse;>5 partners in past 6 mos (regardless of condom use)  Sex Partners - Men only  # sex partners past 3-6 mos - 4-6  Sex activity preferences - Insertive  Condom use - Yes  % condom use - 5125  Partners genders and ages M 25-29 -  Treated for STI? - No  HIV symptoms? N/A -  PrEP Eligibility Substantial risk for HIV;No Symptoms of HIV -  Paper work received? Yes -   Labs: Creatinine Lab Results  Component Value Date   CREATININE 0.92 03/01/2017   CREATININE 1.04 04/23/2014   HIV Lab Results  Component Value Date   HIV NON-REACTIVE 04/02/2017   HIV NON-REACTIVE 03/01/2017   GFR CrCl cannot be calculated (Patient's most recent lab result is older than the maximum 21 days allowed.).  Hepatitis B Lab Results  Component Value Date   HEPBSAB NON-REACTIVE 03/01/2017   Lab Results  Component Value Date   HEPBSAG NON-REACTIVE 03/01/2017   Hepatitis C No results found for:  HCVAB  Hepatitis A No results found for: HAV  RPR and STI Lab Results  Component Value Date   LABRPR NON-REACTIVE 03/01/2017   STI Results GC CT  03/01/2017 Negative Negative   Assessment  Patient is doing well on Truvada PREP therapy. No reported side effects or missed doses. Patient reports one new sexual partner, of which are only engaging in oral sexual behavior and not using condoms. He has an open-relationship with his boyfriend of which he does not always use condoms and is insertive.   Recommendations  HIV Screening today Continue Truvada one tablet once daily  Return to clinic September 4th for repeat labs and follow-up with pharmacy team   Kendryck Lacroix L. Marcy Salvoaymond, PharmD, MS PGY1 Pharmacy Resident

## 2017-07-02 ENCOUNTER — Telehealth: Payer: Self-pay | Admitting: Pharmacist

## 2017-07-02 DIAGNOSIS — Z7252 High risk homosexual behavior: Secondary | ICD-10-CM

## 2017-07-02 LAB — HIV ANTIBODY (ROUTINE TESTING W REFLEX): HIV: NONREACTIVE

## 2017-07-02 MED ORDER — EMTRICITABINE-TENOFOVIR DF 200-300 MG PO TABS: 1.0000 | ORAL_TABLET | Freq: Every day | ORAL | 2 refills | Status: DC

## 2017-07-02 MED FILL — TRUVADA 200-300 MG TABS: 200-300 | 30 days supply | Qty: 30 | Fill #0

## 2017-07-02 NOTE — Telephone Encounter (Signed)
Called Alexander Baird to let him know that his HIV antibody was negative. Will send in 3 more months of Truvada.  [facility] will mail it to him today.

## 2017-07-29 MED FILL — TRUVADA 200-300 MG TABS: 200-300 | 30 days supply | Qty: 30 | Fill #1

## 2017-08-20 ENCOUNTER — Telehealth: Payer: Self-pay | Admitting: Pharmacist

## 2017-08-20 NOTE — Telephone Encounter (Signed)
Patient called requesting to move his September PrEP appointment to next week.  No concerns or issues but he has his work schedule for next week now. I explained that he didn't need to follow-up for another month or so but he was adamant about coming in next week. .Will reschedule him for next week.

## 2017-08-26 MED FILL — TRUVADA 200-300 MG TABS: 200-300 | 30 days supply | Qty: 30 | Fill #2

## 2017-08-27 ENCOUNTER — Encounter (HOSPITAL_COMMUNITY): Payer: Self-pay | Admitting: Emergency Medicine

## 2017-08-27 ENCOUNTER — Emergency Department (HOSPITAL_COMMUNITY)
Admission: EM | Admit: 2017-08-27 | Discharge: 2017-08-28 | Disposition: A | Discharge status: Home / Self Care | Payer: Self-pay | Attending: Emergency Medicine | Admitting: Emergency Medicine

## 2017-08-27 ENCOUNTER — Ambulatory Visit: Payer: Self-pay

## 2017-08-27 ENCOUNTER — Emergency Department (HOSPITAL_COMMUNITY): Payer: Self-pay

## 2017-08-27 ENCOUNTER — Other Ambulatory Visit: Payer: Self-pay

## 2017-08-27 DIAGNOSIS — Y9389 Activity, other specified: Secondary | ICD-10-CM | POA: Insufficient documentation

## 2017-08-27 DIAGNOSIS — S5012XA Contusion of left forearm, initial encounter: Secondary | ICD-10-CM | POA: Insufficient documentation

## 2017-08-27 DIAGNOSIS — S40022A Contusion of left upper arm, initial encounter: Secondary | ICD-10-CM

## 2017-08-27 DIAGNOSIS — W010XXA Fall on same level from slipping, tripping and stumbling without subsequent striking against object, initial encounter: Secondary | ICD-10-CM | POA: Insufficient documentation

## 2017-08-27 DIAGNOSIS — Y929 Unspecified place or not applicable: Secondary | ICD-10-CM | POA: Insufficient documentation

## 2017-08-27 DIAGNOSIS — Y990 Civilian activity done for income or pay: Secondary | ICD-10-CM | POA: Insufficient documentation

## 2017-08-27 MED ORDER — DICLOFENAC SODIUM 1 % TD GEL: 2.0000 g | Freq: Four times a day (QID) | TRANSDERMAL | 0 refills | Status: DC

## 2017-08-27 MED ORDER — KETOROLAC TROMETHAMINE 30 MG/ML IJ SOLN: 30.0000 mg | Freq: Once | INTRAMUSCULAR | Status: DC

## 2017-08-27 MED ORDER — OXYCODONE-ACETAMINOPHEN 5-325 MG PO TABS
1.0000 | ORAL_TABLET | ORAL | Status: DC | PRN
  Administered 2017-08-27: 1 via ORAL
  Filled 2017-08-27: qty 1

## 2017-08-27 NOTE — Discharge Instructions (Signed)
Return to ED for worsening symptoms, numbness in arm, additional injuries, chest pain or shortness of breath.
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
## 2017-08-27 NOTE — ED Notes (Signed)
Pt at xray.  Will bring patient back to room when complete

## 2017-08-28 MED FILL — DICLOFENAC SODIUM 1% GEL: 1 | 12 days supply | Qty: 100 | Fill #0

## 2017-08-30 ENCOUNTER — Ambulatory Visit (INDEPENDENT_AMBULATORY_CARE_PROVIDER_SITE_OTHER): Payer: Self-pay

## 2017-08-30 ENCOUNTER — Ambulatory Visit (INDEPENDENT_AMBULATORY_CARE_PROVIDER_SITE_OTHER): Payer: Self-pay | Admitting: Physician Assistant

## 2017-08-30 ENCOUNTER — Encounter (INDEPENDENT_AMBULATORY_CARE_PROVIDER_SITE_OTHER): Payer: Self-pay | Admitting: Physician Assistant

## 2017-08-30 DIAGNOSIS — M79602 Pain in left arm: Secondary | ICD-10-CM

## 2017-08-30 MED ORDER — PREDNISONE 10 MG (21) PO TBPK: ORAL_TABLET | ORAL | 0 refills | Status: DC

## 2017-08-30 MED ORDER — MELOXICAM 7.5 MG PO TABS: ORAL_TABLET | ORAL | 2 refills | Status: DC

## 2017-08-30 MED ORDER — METHOCARBAMOL 500 MG PO TABS: ORAL_TABLET | ORAL | 0 refills | Status: DC

## 2017-08-30 MED FILL — MELOXICAM 7.5 MG TABLET: 7.5 | 30 days supply | Qty: 30 | Fill #0

## 2017-08-30 MED FILL — predniSONE 10 MG (21) TBPK: 10 | 6 days supply | Qty: 21 | Fill #0

## 2017-08-30 MED FILL — METHOCARBAMOL 500 MG TABLET: 500 | 15 days supply | Qty: 30 | Fill #0
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
## 2017-09-02 ENCOUNTER — Ambulatory Visit (INDEPENDENT_AMBULATORY_CARE_PROVIDER_SITE_OTHER): Payer: Self-pay | Admitting: Physician Assistant

## 2017-09-02 ENCOUNTER — Encounter (HOSPITAL_COMMUNITY): Payer: Self-pay | Admitting: Emergency Medicine

## 2017-09-02 ENCOUNTER — Telehealth (INDEPENDENT_AMBULATORY_CARE_PROVIDER_SITE_OTHER): Payer: Self-pay

## 2017-09-02 ENCOUNTER — Emergency Department (HOSPITAL_COMMUNITY): Payer: Self-pay

## 2017-09-02 ENCOUNTER — Ambulatory Visit (HOSPITAL_COMMUNITY): Admission: EM | Admit: 2017-09-02 | Discharge: 2017-09-02 | Discharge status: Against Medical Advice | Payer: Self-pay

## 2017-09-02 ENCOUNTER — Other Ambulatory Visit: Payer: Self-pay

## 2017-09-02 ENCOUNTER — Emergency Department (HOSPITAL_COMMUNITY)
Admission: EM | Admit: 2017-09-02 | Discharge: 2017-09-02 | Disposition: A | Discharge status: Home / Self Care | Payer: Self-pay | Attending: Emergency Medicine | Admitting: Emergency Medicine

## 2017-09-02 DIAGNOSIS — M5412 Radiculopathy, cervical region: Secondary | ICD-10-CM | POA: Insufficient documentation

## 2017-09-02 DIAGNOSIS — M79602 Pain in left arm: Secondary | ICD-10-CM | POA: Insufficient documentation

## 2017-09-02 MED ORDER — LIDOCAINE 5 % EX PTCH: 1.0000 | MEDICATED_PATCH | CUTANEOUS | 0 refills | Status: DC

## 2017-09-02 NOTE — ED Notes (Signed)
Declined W/C at D/C and was escorted to lobby by RN. 

## 2017-09-02 NOTE — ED Triage Notes (Signed)
Pt c/o left arm pain and a "bump" to the left lower arm. States he landed on his arm on July 27th. States he has been unable to see his ortho Careers advisersurgeon and is having a lot of pain.

## 2017-09-02 NOTE — Telephone Encounter (Signed)
If that severe, he needs to go to the hospital

## 2017-09-02 NOTE — Telephone Encounter (Signed)
error 

## 2017-09-02 NOTE — Telephone Encounter (Signed)
See message.

## 2017-09-02 NOTE — ED Provider Notes (Signed)
[facility]   CSN: [id number] Arrival date & time: 09/02/17  1505     History   Chief Complaint Chief Complaint  Patient presents with  . Arm Pain    HPI Alexander Baird is a 27 y.o. male.  27 year old male returns to the emergency room with ongoing left arm pain.  Patient was seen originally on July 30 when he was changing the tire on his vehicle and he fell landing on his left shoulder injuring his left arm.  Patient followed up with orthopedics on August 2 and was given a prednisone Dosepak and a muscle relaxer, advised to start meloxicam after completing the steroid Dosepak.  Patient states that he is taking medications as prescribed without any relief of his pain.  Patient has had x-rays of his forearm, neck, shoulder which were all unremarkable.  Patient notes swelling in his left forearm with severe pain in his shoulder and entire left arm.  Patient went to orthopedics today to be seen, they were unable to see him but did place him in a shoulder immobilizer which has minimally helped his pain.  No other injuries, complaints, concerns.     History reviewed. No pertinent past medical history.  Patient Active Problem List   Diagnosis Date Noted  . Left arm pain 08/30/2017    History reviewed. No pertinent surgical history.      Home Medications    Prior to Admission medications   Medication Sig Start Date End Date Taking? Authorizing Provider  diclofenac sodium (VOLTAREN) 1 % GEL Apply 2 g topically 4 (four) times daily. 08/27/17   Khatri, Hina, PA-C  emtricitabine-tenofovir (TRUVADA) 200-300 MG tablet Take 1 tablet by mouth daily. 07/02/17   Kuppelweiser, Cassie L, RPH-CPP  lidocaine (LIDODERM) 5 % Place 1 patch onto the skin daily. Remove & Discard patch within 12 hours or as directed by MD 09/02/17   Jeannie FendMurphy, Dalphine Cowie A, PA-C  meloxicam (MOBIC) 7.5 MG tablet Take one tab po qd with food prn pain once finished with sterapred  taper 08/30/17   Cristie HemStanbery, Mary L, PA-C  methocarbamol (ROBAXIN) 500 MG tablet Take one tab po bid prn muscle spasm/pain 08/30/17   Cristie HemStanbery, Mary L, PA-C  predniSONE (STERAPRED UNI-PAK 21 TAB) 10 MG (21) TBPK tablet Take as directed 08/30/17   Cristie HemStanbery, Mary L, PA-C    Family History No family history on file.  Social History Social History   Tobacco Use  . Smoking status: Never Smoker  . Smokeless tobacco: Never Used  Substance Use Topics  . Alcohol use: No  . Drug use: No     Allergies   Patient has no known allergies.   Review of Systems Review of Systems  Constitutional: Negative for fever.  Musculoskeletal: Positive for arthralgias and myalgias. Negative for back pain, joint swelling, neck pain and neck stiffness.  Skin: Negative for rash and wound.  Allergic/Immunologic: Negative for immunocompromised state.  Neurological: Negative for weakness and numbness.  Hematological: Does not bruise/bleed easily.  Psychiatric/Behavioral: Negative for confusion.  All other systems reviewed and are negative.    Physical Exam Updated Vital Signs BP 131/76 (BP Location: Right Arm)   Pulse 70   Temp 98.2 F (36.8 C) (Oral)   Resp 16   SpO2 100%   Physical Exam  Constitutional: He is oriented to person, place, and time. He appears well-developed and well-nourished. No distress.  HENT:  Head: Normocephalic and atraumatic.  Cardiovascular: Intact distal pulses.  Pulmonary/Chest:  Effort normal.  Musculoskeletal: He exhibits tenderness. He exhibits no edema or deformity.       Left shoulder: He exhibits decreased range of motion, tenderness and bony tenderness. He exhibits no swelling, no effusion, no crepitus, no deformity, no laceration and normal pulse.       Left wrist: He exhibits normal range of motion and no tenderness.       Cervical back: He exhibits tenderness. He exhibits normal range of motion and no bony tenderness.       Back:       Left forearm: He exhibits  tenderness. He exhibits no swelling, no edema, no deformity and no laceration.  Mild TTP left trapezius area, moderate TTP at left [facility] joint. Patient unable to tolerate any ROM of the shoulder secondary to pain.   Neurological: He is alert and oriented to person, place, and time.  Skin: Skin is warm and dry. Capillary refill takes less than 2 seconds. No rash noted. He is not diaphoretic. No erythema. No pallor.  Psychiatric: He has a normal mood and affect. His behavior is normal.  Nursing note and vitals reviewed.    ED Treatments / Results  Labs (all labs ordered are listed, but only abnormal results are displayed) Labs Reviewed - No data to display  EKG None  Radiology Dg Forearm Left  Result Date: 09/02/2017 CLINICAL DATA:  Injury to the left forearm 08/24/2017. Continued left forearm pain. EXAM: LEFT FOREARM - 2 VIEW COMPARISON:  None. FINDINGS: There is no evidence of fracture or other focal bone lesions. Soft tissues are unremarkable. IMPRESSION: Negative. Electronically Signed   By: Elige Ko   On: 09/02/2017 16:33    Procedures Procedures (including critical care time)  Medications Ordered in ED Medications - No data to display   Initial Impression / Assessment and Plan / ED Course  I have reviewed the triage vital signs and the nursing notes.  Pertinent labs & imaging results that were available during my care of the patient were reviewed by me and considered in my medical decision making (see chart for details).  Clinical Course as of Sep 02 1821  Mon Sep 02, 2017  [id number] 27 year old male with left shoulder and arm pain.  Patient is wearing a shoulder immobilizer, taking on his own Dosepak and muscle relaxer without relief of his pain.  No new injuries.  There is no swelling, ecchymosis, abrasions, crepitus.  He has tenderness left trapezius, left AC joint, diffuse left forearm.  No appreciable swelling in the forearm.  He is neurovascular intact.  Patient was given  prescription for Lidoderm patch and advised to follow-up with his orthopedist as previously scheduled for this Friday.   [LM]    Clinical Course User Index [LM] Jeannie Fend, PA-C    Final Clinical Impressions(s) / ED Diagnoses   Final diagnoses:  Left arm pain  Cervical radiculopathy    ED Discharge Orders        Ordered    lidocaine (LIDODERM) 5 %  Every 24 hours     09/02/17 1813       Jeannie Fend, PA-C 09/02/17 Ephriam Knuckles, MD 09/05/17 [phone]

## 2017-09-02 NOTE — Telephone Encounter (Signed)
Patient called stating that his left arm is in severe pain.  Patient came by the office this morning and insisted that he needed a sling for his left arm.  Patient was put in the sling and stated that it felt much better.  Cb# is [phone].  Please advise.  Thank you.

## 2017-09-02 NOTE — ED Triage Notes (Signed)
Pt states on the 27th he landed on his arm. Pt went to the hospital and went to ortho follow up and given a sling, given muscle relaxers and pain medicine, pt states hes still hurting.

## 2017-09-02 NOTE — Discharge Instructions (Addendum)
Continue with previously prescribed medications.  Apply 1 pain patch to area for 24 hours.

## 2017-09-02 NOTE — ED Notes (Addendum)
Spoke with patient about what treatment he needs, pt keeps stating "I need this arm fixed now". Pt has taken his muscle relaxers, steroids, and antiinflammatories without relief, given sling by ortho. Pt keeps stating "the othropedic office didn't do anything for me im still hurting and I need my arm fixed". Pt is requesting we fix his arm and do physical therpay here. Pt informed the providers could possibly order a shot of steroid/pain medication for him to give him some relief today, pt states "i've already taken pain medicine today and its not helping!" PT informed we can given him a different follow up for ortho and follow up info to find a PCP and pt refused this information. Pt stated. "im going to leave since you cant do anything for me". Pt offered evaluation by a provider here, pt refused. Left without being seen.

## 2017-09-03 NOTE — Telephone Encounter (Signed)
Tried to call patient no answer. 

## 2017-09-13 ENCOUNTER — Ambulatory Visit (INDEPENDENT_AMBULATORY_CARE_PROVIDER_SITE_OTHER): Payer: Self-pay | Admitting: Orthopaedic Surgery

## 2017-10-02 ENCOUNTER — Ambulatory Visit: Payer: Self-pay

## 2017-10-21 ENCOUNTER — Ambulatory Visit (INDEPENDENT_AMBULATORY_CARE_PROVIDER_SITE_OTHER): Payer: Self-pay | Admitting: Pharmacist

## 2017-10-21 DIAGNOSIS — Z7252 High risk homosexual behavior: Secondary | ICD-10-CM

## 2017-10-21 NOTE — Progress Notes (Signed)
Date:  10/21/2017   HPI: Alexander Baird is a 27 y.o. male who presents to the [facility] pharmacy clinic today for PrEP follow-up.  Insured   []    Uninsured  [x]    Patient Active Problem List   Diagnosis Date Noted  . Left arm pain 08/30/2017    Patient's Medications  New Prescriptions   No medications on file  Previous Medications   DICLOFENAC SODIUM (VOLTAREN) 1 % GEL    Apply 2 g topically 4 (four) times daily.   EMTRICITABINE-TENOFOVIR (TRUVADA) 200-300 MG TABLET    Take 1 tablet by mouth daily.   LIDOCAINE (LIDODERM) 5 %    Place 1 patch onto the skin daily. Remove & Discard patch within 12 hours or as directed by MD   MELOXICAM (MOBIC) 7.5 MG TABLET    Take one tab po qd with food prn pain once finished with sterapred taper   METHOCARBAMOL (ROBAXIN) 500 MG TABLET    Take one tab po bid prn muscle spasm/pain   PREDNISONE (STERAPRED UNI-PAK 21 TAB) 10 MG (21) TBPK TABLET    Take as directed  Modified Medications   No medications on file  Discontinued Medications   No medications on file    Allergies: No Known Allergies  Past Medical History: No past medical history on file.  Social History: Social History   Socioeconomic History  . Marital status: Single    Spouse name: Not on file  . Number of children: Not on file  . Years of education: Not on file  . Highest education level: Not on file  Occupational History  . Not on file  Social Needs  . Financial resource strain: Not on file  . Food insecurity:    Worry: Not on file    Inability: Not on file  . Transportation needs:    Medical: Not on file    Non-medical: Not on file  Tobacco Use  . Smoking status: Never Smoker  . Smokeless tobacco: Never Used  Substance and Sexual Activity  . Alcohol use: No  . Drug use: No  . Sexual activity: Never  Lifestyle  . Physical activity:    Days per week: Not on file    Minutes per session: Not on file  . Stress: Not on file  Relationships  . Social connections:   Talks on phone: Not on file    Gets together: Not on file    Attends religious service: Not on file    Active member of club or organization: Not on file    Attends meetings of clubs or organizations: Not on file    Relationship status: Not on file  Other Topics Concern  . Not on file  Social History Narrative  . Not on file    CHL HIV PREP FLOWSHEET RESULTS 10/21/2017 03/01/2017 03/01/2017  Insurance Status Uninsured - Uninsured  How did you hear? - [facility] -  Gender at birth Male Male Male  Gender identity cis-Male - cis-Male  SexPro Score - 9 -  Risk for HIV Condomless vaginal or anal intercourse - Condomless vaginal or anal intercourse;>5 partners in past 6 mos (regardless of condom use)  Sex Partners Men only - Men only  # sex partners past 3-6 mos 1-3 - 4-6  Sex activity preferences Insertive - Insertive  Condom use No - Yes  % condom use - - 3925  Partners genders and ages - M 25-29 -  Treated for STI? No - No  HIV  symptoms? N/A N/A -  PrEP Eligibility HIV negative;CrCl >60 ml/min Substantial risk for HIV;No Symptoms of HIV -  Paper work received? - Yes -    Labs:  SCr: Lab Results  Component Value Date   CREATININE 0.92 03/01/2017   CREATININE 1.04 04/23/2014   HIV Lab Results  Component Value Date   HIV NON-REACTIVE 07/01/2017   HIV NON-REACTIVE 04/02/2017   HIV NON-REACTIVE 03/01/2017   Hepatitis B Lab Results  Component Value Date   HEPBSAB NON-REACTIVE 03/01/2017   HEPBSAG NON-REACTIVE 03/01/2017   Hepatitis C No results found for: HEPCAB, HCVRNAPCRQN Hepatitis A No results found for: HAV RPR and STI Lab Results  Component Value Date   LABRPR NON-REACTIVE 03/01/2017    STI Results GC CT  03/01/2017 Negative Negative   Assessment: Alexander Baird reports that he had missed his last few appointments with [place] due to his car breaking down, but it has since been fixed. He has been out of Truvada for a couple weeks but while he was taking it, he did  not experience any side effects after an initial "start up syndrome." He reports that he has a new boyfriend who he states is his only partner. They are thinking about getting married within the next 2 years or so. His boyfriend was recently tested (did not specify if it was HIV and/or STD testing) and they are still waiting for those results.  Plan: -HIV antibody, BMET, RPR, and urine STD cytology -3 more months of Truvada if HIV negative -F/u on 12/16 at 3:15pm for PrEP follow-up  Arvilla Market, PharmD PGY1 Pharmacy Resident Phone [phone] 10/21/2017     4:09 PM

## 2017-10-22 LAB — URINE CYTOLOGY ANCILLARY ONLY
CHLAMYDIA, DNA PROBE: NEGATIVE
NEISSERIA GONORRHEA: NEGATIVE

## 2017-10-22 LAB — BASIC METABOLIC PANEL
BUN: 12 mg/dL (ref 7–25)
CALCIUM: 9.3 mg/dL (ref 8.6–10.3)
CHLORIDE: 104 mmol/L (ref 98–110)
CO2: 30 mmol/L (ref 20–32)
Creat: 0.97 mg/dL (ref 0.60–1.35)
GLUCOSE: 94 mg/dL (ref 65–99)
Potassium: 3.8 mmol/L (ref 3.5–5.3)
Sodium: 140 mmol/L (ref 135–146)

## 2017-10-22 LAB — HIV ANTIBODY (ROUTINE TESTING W REFLEX): HIV 1&2 Ab, 4th Generation: NONREACTIVE

## 2017-10-22 LAB — RPR: RPR Ser Ql: NONREACTIVE

## 2017-10-23 ENCOUNTER — Telehealth: Payer: Self-pay | Admitting: Pharmacist

## 2017-10-23 DIAGNOSIS — Z7252 High risk homosexual behavior: Secondary | ICD-10-CM

## 2017-10-23 MED ORDER — EMTRICITABINE-TENOFOVIR DF 200-300 MG PO TABS: 1.0000 | ORAL_TABLET | Freq: Every day | ORAL | 2 refills | Status: DC

## 2017-10-23 MED FILL — TRUVADA 200-300 MG TABS: 200-300 | 30 days supply | Qty: 30 | Fill #0

## 2017-10-23 NOTE — Telephone Encounter (Signed)
Called patient to let him know that his HIV antibody was negative.  Will send in 3 more months of Truvada.  

## 2017-11-20 MED FILL — TRUVADA 200-300 MG TABS: 200-300 | 30 days supply | Qty: 30 | Fill #1

## 2017-12-19 MED FILL — TRUVADA 200-300 MG TABS: 200-300 | 30 days supply | Qty: 30 | Fill #2

## 2018-01-13 ENCOUNTER — Ambulatory Visit: Payer: Self-pay

## 2018-03-06 ENCOUNTER — Telehealth: Payer: Self-pay | Admitting: Pharmacy Technician

## 2018-03-06 NOTE — Telephone Encounter (Signed)
Patient called in reference to Truvada refill being shipped to his home and being out of refills.  Patient was a no show at last follow-up appointment and will need labs before more refills issued.  Appointment made for 03/07/2018 at 11:30am for follow-up.  At that appointment he would like to discuss future refills.

## 2018-03-07 ENCOUNTER — Ambulatory Visit: Payer: Self-pay | Admitting: Pharmacist

## 2018-09-09 ENCOUNTER — Emergency Department (HOSPITAL_COMMUNITY)
Admission: EM | Admit: 2018-09-09 | Discharge: 2018-09-09 | Disposition: A | Discharge status: Home / Self Care | Payer: Self-pay | Attending: Emergency Medicine | Admitting: Emergency Medicine

## 2018-09-09 ENCOUNTER — Encounter (HOSPITAL_COMMUNITY): Payer: Self-pay

## 2018-09-09 ENCOUNTER — Emergency Department (HOSPITAL_COMMUNITY): Payer: Self-pay

## 2018-09-09 ENCOUNTER — Other Ambulatory Visit: Payer: Self-pay

## 2018-09-09 DIAGNOSIS — W19XXXA Unspecified fall, initial encounter: Secondary | ICD-10-CM | POA: Insufficient documentation

## 2018-09-09 DIAGNOSIS — Y998 Other external cause status: Secondary | ICD-10-CM | POA: Insufficient documentation

## 2018-09-09 DIAGNOSIS — S0990XA Unspecified injury of head, initial encounter: Secondary | ICD-10-CM | POA: Insufficient documentation

## 2018-09-09 DIAGNOSIS — Y9289 Other specified places as the place of occurrence of the external cause: Secondary | ICD-10-CM | POA: Insufficient documentation

## 2018-09-09 DIAGNOSIS — Y9389 Activity, other specified: Secondary | ICD-10-CM | POA: Insufficient documentation

## 2018-09-09 DIAGNOSIS — Y9259 Other trade areas as the place of occurrence of the external cause: Secondary | ICD-10-CM | POA: Insufficient documentation

## 2018-09-09 DIAGNOSIS — R569 Unspecified convulsions: Secondary | ICD-10-CM | POA: Insufficient documentation

## 2018-09-09 LAB — CBC WITH DIFFERENTIAL/PLATELET
Abs Immature Granulocytes: 0.04 10*3/uL (ref 0.00–0.07)
Basophils Absolute: 0 10*3/uL (ref 0.0–0.1)
Basophils Relative: 1 %
Eosinophils Absolute: 0.2 10*3/uL (ref 0.0–0.5)
Eosinophils Relative: 3 %
HCT: 43.6 % (ref 39.0–52.0)
Hemoglobin: 15.2 g/dL (ref 13.0–17.0)
Immature Granulocytes: 1 %
Lymphocytes Relative: 19 %
Lymphs Abs: 1.5 10*3/uL (ref 0.7–4.0)
MCH: 31.2 pg (ref 26.0–34.0)
MCHC: 34.9 g/dL (ref 30.0–36.0)
MCV: 89.5 fL (ref 80.0–100.0)
Monocytes Absolute: 0.7 10*3/uL (ref 0.1–1.0)
Monocytes Relative: 9 %
Neutro Abs: 5.4 10*3/uL (ref 1.7–7.7)
Neutrophils Relative %: 67 %
Platelets: 206 10*3/uL (ref 150–400)
RBC: 4.87 MIL/uL (ref 4.22–5.81)
RDW: 11.8 % (ref 11.5–15.5)
WBC: 8 10*3/uL (ref 4.0–10.5)
nRBC: 0 % (ref 0.0–0.2)

## 2018-09-09 LAB — RAPID URINE DRUG SCREEN, HOSP PERFORMED
Amphetamines: NOT DETECTED
Barbiturates: NOT DETECTED
Benzodiazepines: NOT DETECTED
Cocaine: NOT DETECTED
Opiates: NOT DETECTED
Tetrahydrocannabinol: NOT DETECTED

## 2018-09-09 LAB — COMPREHENSIVE METABOLIC PANEL
ALT: 22 U/L (ref 0–44)
AST: 25 U/L (ref 15–41)
Albumin: 4.1 g/dL (ref 3.5–5.0)
Alkaline Phosphatase: 61 U/L (ref 38–126)
Anion gap: 10 (ref 5–15)
BUN: 10 mg/dL (ref 6–20)
CO2: 25 mmol/L (ref 22–32)
Calcium: 8.8 mg/dL — ABNORMAL LOW (ref 8.9–10.3)
Chloride: 105 mmol/L (ref 98–111)
Creatinine, Ser: 1.17 mg/dL (ref 0.61–1.24)
GFR calc Af Amer: >60 mL/min (ref >60)
GFR calc non Af Amer: >60 mL/min (ref >60)
Glucose, Bld: 86 mg/dL (ref 70–99)
Potassium: 3.7 mmol/L (ref 3.5–5.1)
Sodium: 140 mmol/L (ref 135–145)
Total Bilirubin: 1.4 mg/dL — ABNORMAL HIGH (ref 0.3–1.2)
Total Protein: 6.8 g/dL (ref 6.5–8.1)

## 2018-09-09 LAB — ETHANOL: Alcohol, Ethyl (B): <10 mg/dL (ref <10)

## 2018-09-09 LAB — URINALYSIS, ROUTINE W REFLEX MICROSCOPIC
Bilirubin Urine: NEGATIVE
Glucose, UA: NEGATIVE mg/dL
Hgb urine dipstick: NEGATIVE
Ketones, ur: NEGATIVE mg/dL
Leukocytes,Ua: NEGATIVE
Nitrite: NEGATIVE
Protein, ur: NEGATIVE mg/dL
Specific Gravity, Urine: 1.019 (ref 1.005–1.030)
pH: 6 (ref 5.0–8.0)

## 2018-09-09 LAB — CBG MONITORING, ED: Glucose-Capillary: 78 mg/dL (ref 70–99)

## 2018-09-09 MED ORDER — SODIUM CHLORIDE 0.9 % IV BOLUS
1000.0000 mL | Freq: Once | INTRAVENOUS | Status: AC
  Administered 2018-09-09: 20:00:00 1000 mL via INTRAVENOUS

## 2018-09-09 NOTE — ED Triage Notes (Signed)
Pt bib ems for witnessed fall, hitting his head on the ground with seizure like activity. Pt denies any hx of same. Hematoma to right forehead and small lac to the back of his head. Pt confused, asking repetitive questions, states the last thing he remembers is walking into the dollar store. Pt arrives to ed alert, asking questions, VSS [id number]

## 2018-09-09 NOTE — ED Notes (Signed)
Reviewing DC paperwork with pt. Informed pt that he cannot drive d/t possible seizure. Pt became angry and took own iv out stating that he would sue MD and everyone else. Pt told to follow up with neurology for further assessment. Pt walked out and refused to sign DC paperwork. NAD NARD

## 2018-09-09 NOTE — ED Notes (Signed)
Patient transported to CT 

## 2018-09-09 NOTE — ED Provider Notes (Signed)
Emergency Department Provider Note   I have reviewed the triage vital signs and the nursing notes.   HISTORY  Chief Complaint Seizures and Fall   HPI Alexander Baird is a 28 y.o. male with no significant PMH presents to the ED for evaluation of seizure like activity.  Patient states that he drove to the dollar store to get some supplies.  He went in and bought his items and then does not remember anything until EMS arrives.  He denies drinking alcohol or using drugs.  He has no prior history of seizure.  He did not feel strange or different prior to the event that he can remember.  He denies any chest pain, shortness of breath, heart palpitations.  He reports currently having a mild headache.  No shoulder pain.  He does feel like he bit his tongue.   Level 5 caveat: Post-ictal confusion (mild)   History reviewed. No pertinent past medical history.  Patient Active Problem List   Diagnosis Date Noted  . Left arm pain 08/30/2017    History reviewed. No pertinent surgical history.  Allergies Patient has no known allergies.  No family history on file.  Social History Social History   Tobacco Use  . Smoking status: Never Smoker  . Smokeless tobacco: Never Used  Substance Use Topics  . Alcohol use: No  . Drug use: No    Review of Systems  Constitutional: No fever/chills Eyes: No visual changes. ENT: No sore throat. Tongue pain.  Cardiovascular: Denies chest pain. Respiratory: Denies shortness of breath. Gastrointestinal: No abdominal pain.  No nausea, no vomiting.  No diarrhea.  No constipation. Genitourinary: Negative for dysuria. Musculoskeletal: Negative for back pain. Skin: Negative for rash. Neurological: Negative for focal weakness or numbness. Positive mild HA and positive seizure like activity (by report).   10-point ROS otherwise negative.  ____________________________________________   PHYSICAL EXAM:  VITAL SIGNS: ED Triage Vitals [09/09/18 1942]   Enc Vitals Group     BP 132/82     Pulse Rate 90     Resp 16     Temp 98.2 F (36.8 C)     Temp Source Oral     SpO2 100 %   Constitutional: Alert and oriented. Well appearing and in no acute distress. Eyes: Conjunctivae are normal. PERRL.  Head: Abrasion with mild hematoma to the right temporal scalp. No laceration.  Nose: No congestion/rhinnorhea. Mouth/Throat: Mucous membranes are moist. Abrasion to the bilateral tongue along the lateral border. No through-and-through laceration.  Neck: No stridor. C-collar in place.  Cardiovascular: Normal rate, regular rhythm. Good peripheral circulation. Grossly normal heart sounds.   Respiratory: Normal respiratory effort.  No retractions. Lungs CTAB. Gastrointestinal: Soft and nontender. No distention.  Musculoskeletal: No lower extremity tenderness nor edema. No gross deformities of extremities. Neurologic:  Normal speech and language. No gross focal neurologic deficits are appreciated.  Skin:  Skin is warm, dry and intact. No rash noted.  ____________________________________________   LABS (all labs ordered are listed, but only abnormal results are displayed)  Labs Reviewed  COMPREHENSIVE METABOLIC PANEL - Abnormal; Notable for the following components:      Result Value   Calcium 8.8 (*)    Total Bilirubin 1.4 (*)    All other components within normal limits  ETHANOL  CBC WITH DIFFERENTIAL/PLATELET  URINALYSIS, ROUTINE W REFLEX MICROSCOPIC  RAPID URINE DRUG SCREEN, HOSP PERFORMED  CBG MONITORING, ED   ____________________________________________  EKG   EKG Interpretation  Date/Time:  Tuesday September 09 2018 19:44:52 EDT Ventricular Rate:  101 PR Interval:    QRS Duration: 93 QT Interval:  326 QTC Calculation: 423 R Axis:   83 Text Interpretation:  Sinus tachycardia No STEMI  Confirmed by Nanda Quinton [phone]) on 09/09/2018 7:52:29 PM       ____________________________________________  RADIOLOGY  Ct Head Wo  Contrast  Result Date: 09/09/2018 CLINICAL DATA:  Fall.  Seizure. EXAM: CT HEAD WITHOUT CONTRAST TECHNIQUE: Contiguous axial images were obtained from the base of the skull through the vertex without intravenous contrast. COMPARISON:  CT head dated July 23, 2014 and July 28, 2010. FINDINGS: Brain: No evidence of acute infarction, hemorrhage, hydrocephalus, extra-axial collection or mass lesion/mass effect. Vascular: No hyperdense vessel or unexpected calcification. Skull: Normal. Negative for fracture or focal lesion. Sinuses/Orbits: No acute finding. Similar-appearing sequelae of chronic right maxillary sinusitis. Other: None. IMPRESSION: No acute intracranial abnormality. Electronically Signed   By: Titus Dubin M.D.   On: 09/09/2018 20:52    ____________________________________________   PROCEDURES  Procedure(s) performed:   Procedures  None  ____________________________________________   INITIAL IMPRESSION / ASSESSMENT AND PLAN / ED COURSE  Pertinent labs & imaging results that were available during my care of the patient were reviewed by me and considered in my medical decision making (see chart for details).   Patient presents to the emergency department for evaluation of likely seizure.  Seizure activity was witnessed on scene.  He fell and had some head injury/trauma.  Patient is awake and alert.  He is having fluent conversation with me but is somewhat tangential at times and slightly repetitive.  Suspect post ictal type symptoms.  No tongue laceration requiring repair.  No evidence of shoulder dislocation or other injury.  Will obtain CT imaging of the head and cervical spine.  Screening labs are pending.  No clear inciting event to cause seizure.   10:33 PM  Went back to the patient room after review of his lab work and CT imaging.  Removed his cervical spine collar.  Patient does have an abrasion to the crown of the scalp now visualized with no laceration.  I had a discussion  that now the patient has had a seizure he cannot drive a car until cleared by a neurologist.  He discussed that if he were to have a seizure while driving he could injure/kill himself or another driver inadvertently.  Patient became very upset and angry.  He began pulling off his EKG leads and asking for all names saying that he would be calling his lawyer. He keeps saying "no. No. You can't take my driving away. No." He claimed that he did not agree to come to the ED after his seizure. I again explained that he is not allowed to drive and we reviewed why. This was documented on his ED discharge paperwork as well.  His brother is in the ED and will be driving him home from the ED. He understands the driving limitation and will help to get the patient to Neurology as an outpatient.  ____________________________________________  FINAL CLINICAL IMPRESSION(S) / ED DIAGNOSES  Final diagnoses:  Seizure ([place])  Injury of head, initial encounter    MEDICATIONS GIVEN DURING THIS VISIT:  Medications  sodium chloride 0.9 % bolus 1,000 mL (0 mLs Intravenous Stopped 09/09/18 2059)    Note:  This document was prepared using Dragon voice recognition software and may include unintentional dictation errors.  Nanda Quinton, MD Emergency Medicine    Teryl Gubler, [facility], MD 09/10/18  0919  

## 2018-09-09 NOTE — ED Notes (Signed)
[facility] on [address] per pt request. Staff stated that pt care will not be towed. Updated pt

## 2018-09-09 NOTE — ED Notes (Signed)
PT left with brother

## 2018-09-09 NOTE — ED Notes (Signed)
CBG collected. Result "94." EDP, Long, notified.

## 2018-09-09 NOTE — Discharge Instructions (Signed)
You have been seen in the emergency department today for a likely seizure.  Your workup today including labs are within normal limits.  Please follow up with your doctor as soon as possible regarding today's emergency department visit and your likely seizure.  You will also need to follow up with a neurologist as soon as possible, please call for appointment.  If you have been prescribed a medication for your seizures, please take this medication as prescribed.  As we have discussed it is very important that you DO NOT drive until you have been seen and cleared by your neurologist.  Please drink plenty of fluids, get plenty of sleep and avoid any alcohol or drug use.  Return to the emergency department if you have any further seizures, develop any weakness/numbness of any arm/leg, confusion, slurred speech, or sudden/severe headache.  

## 2019-02-27 ENCOUNTER — Encounter (HOSPITAL_COMMUNITY): Payer: Self-pay

## 2019-02-27 ENCOUNTER — Other Ambulatory Visit: Payer: Self-pay

## 2019-02-27 ENCOUNTER — Ambulatory Visit (HOSPITAL_COMMUNITY)
Admission: EM | Admit: 2019-02-27 | Discharge: 2019-02-27 | Disposition: A | Discharge status: Home / Self Care | Payer: Self-pay | Attending: Urgent Care | Admitting: Urgent Care

## 2019-02-27 DIAGNOSIS — Z7251 High risk heterosexual behavior: Secondary | ICD-10-CM | POA: Insufficient documentation

## 2019-02-27 DIAGNOSIS — R369 Urethral discharge, unspecified: Secondary | ICD-10-CM | POA: Insufficient documentation

## 2019-02-27 MED ORDER — LIDOCAINE HCL (PF) 1 % IJ SOLN
INTRAMUSCULAR | Status: AC
  Filled 2019-02-27: qty 2

## 2019-02-27 MED ORDER — CEFTRIAXONE SODIUM 500 MG IJ SOLR
500.0000 mg | Freq: Once | INTRAMUSCULAR | Status: AC
  Administered 2019-02-27: 15:00:00 500 mg via INTRAMUSCULAR

## 2019-02-27 MED ORDER — CEFTRIAXONE SODIUM 500 MG IJ SOLR
INTRAMUSCULAR | Status: AC
  Filled 2019-02-27: qty 500

## 2019-02-27 MED ORDER — DOXYCYCLINE HYCLATE 100 MG PO CAPS: 100.0000 mg | ORAL_CAPSULE | Freq: Two times a day (BID) | ORAL | 0 refills | Status: DC

## 2019-02-27 NOTE — ED Triage Notes (Signed)
Pt presents with yellow penile discharge and burning with urination X 2 days.

## 2019-02-27 NOTE — ED Provider Notes (Signed)
[facility]   MRN: [id number] DOB: 15-Apr-1990  Subjective:   Gonzalo Waymire is a 29 y.o. male presenting for 2-day history of frank penile discharge with dysuria.  Patient went to the health department and got tested today for hepatitis C, HIV and syphilis.  This was all negative.  Denies hematuria, urinary frequency, penile swelling, testicular pain, testicular swelling, anal pain, groin pain.   No current facility-administered medications for this encounter.  Current Outpatient Medications:  .  diclofenac sodium (VOLTAREN) 1 % GEL, Apply 2 g topically 4 (four) times daily., Disp: 1 Tube, Rfl: 0 .  emtricitabine-tenofovir (TRUVADA) 200-300 MG tablet, Take 1 tablet by mouth daily., Disp: 30 tablet, Rfl: 2 .  lidocaine (LIDODERM) 5 %, Place 1 patch onto the skin daily. Remove & Discard patch within 12 hours or as directed by MD, Disp: 30 patch, Rfl: 0 .  meloxicam (MOBIC) 7.5 MG tablet, Take one tab po qd with food prn pain once finished with sterapred taper, Disp: 30 tablet, Rfl: 2 .  methocarbamol (ROBAXIN) 500 MG tablet, Take one tab po bid prn muscle spasm/pain, Disp: 30 tablet, Rfl: 0 .  predniSONE (STERAPRED UNI-PAK 21 TAB) 10 MG (21) TBPK tablet, Take as directed, Disp: 21 tablet, Rfl: 0   No Known Allergies  History reviewed. No pertinent past medical history.   History reviewed. No pertinent surgical history.  Family History  Family history unknown: Yes    Social History   Tobacco Use  . Smoking status: Never Smoker  . Smokeless tobacco: Never Used  Substance Use Topics  . Alcohol use: No  . Drug use: No    ROS   Objective:   Vitals: BP 130/77 (BP Location: Right Arm)   Pulse 84   Temp 99.3 F (37.4 C) (Oral)   Resp 17   SpO2 100%   Physical Exam Constitutional:      General: He is not in acute distress.    Appearance: Normal appearance. He is well-developed and normal weight. He is not ill-appearing, toxic-appearing or diaphoretic.  HENT:     Head: Normocephalic and atraumatic.     Right Ear: External ear normal.     Left Ear: External ear normal.     Nose: Nose normal.     Mouth/Throat:     Pharynx: Oropharynx is clear.  Eyes:     General: No scleral icterus.       Right eye: No discharge.        Left eye: No discharge.     Extraocular Movements: Extraocular movements intact.     Pupils: Pupils are equal, round, and reactive to light.  Cardiovascular:     Rate and Rhythm: Normal rate.  Pulmonary:     Effort: Pulmonary effort is normal.  Genitourinary:    Penis: Circumcised. Erythema (overlying meatus) and discharge present. No phimosis, paraphimosis, hypospadias, tenderness, swelling or lesions.   Musculoskeletal:     Cervical back: Normal range of motion.  Neurological:     Mental Status: He is alert and oriented to person, place, and time.  Psychiatric:        Mood and Affect: Mood normal.        Behavior: Behavior normal.        Thought Content: Thought content normal.        Judgment: Judgment normal.      Assessment and Plan :   1. Penile discharge   2. Unprotected sex     Patient treated empirically as  per CDC guidelines with IM ceftriaxone, doxycycline as an outpatient.  Labs pending.   Counseled on safe sex practices including abstaining for 1 week following treatment.  Counseled patient on potential for adverse effects with medications prescribed/recommended today, ER and return-to-clinic precautions discussed, patient verbalized understanding.    Jaynee Eagles, PA-C 02/27/19 1418

## 2019-02-27 NOTE — Discharge Instructions (Signed)
Avoid all forms of sexual intercourse (oral, vaginal, anal) for the next 7 days to avoid spreading/reinfecting. Return if symptoms worsen/do not resolve, you develop fever, abdominal pain, blood in your urine, or are re-exposed to an STI.  

## 2019-03-03 ENCOUNTER — Telehealth (HOSPITAL_COMMUNITY): Payer: Self-pay | Admitting: Emergency Medicine

## 2019-03-03 LAB — CYTOLOGY, (ORAL, ANAL, URETHRAL) ANCILLARY ONLY
Chlamydia: POSITIVE — AB
Neisseria Gonorrhea: POSITIVE — AB
Trichomonas: NEGATIVE

## 2019-03-03 NOTE — Telephone Encounter (Signed)
Test for gonorrhea was positive. This was treated at the urgent care visit with IM rocephin 500mg . Please refrain from sexual intercourse for 7 days after treatment to give the medicine time to work. Sexual partners need to be notified and tested/treated. Condoms may reduce risk of reinfection. Recheck or followup with PCP for further evaluation if symptoms are not improving. [facility] notified.   Chlamydia is positive.  This was treated at the urgent care visit with doxycycline  Please refrain from sexual intercourse for 7 days to give the medicine time to work.  Sexual partners need to be notified and tested/treated.  Condoms may reduce risk of reinfection.  Recheck or followup with PCP for further evaluation if symptoms are not improving.  [facility] notified.  Patient contacted by phone and made aware of    results. Pt verbalized understanding and had all questions answered.

## 2020-04-27 DIAGNOSIS — A749 Chlamydial infection, unspecified: Secondary | ICD-10-CM | POA: Insufficient documentation

## 2020-04-27 DIAGNOSIS — A549 Gonococcal infection, unspecified: Secondary | ICD-10-CM | POA: Insufficient documentation

## 2020-05-28 IMAGING — DX DG FOREARM 2V*L*
2 series · 2 of 2 positions shown · non-contrast
Comparison: None.

CLINICAL DATA: Injury to the left forearm 08/24/2017. Continued
left forearm pain.

EXAM:
LEFT FOREARM - 2 VIEW

[forearm ap]
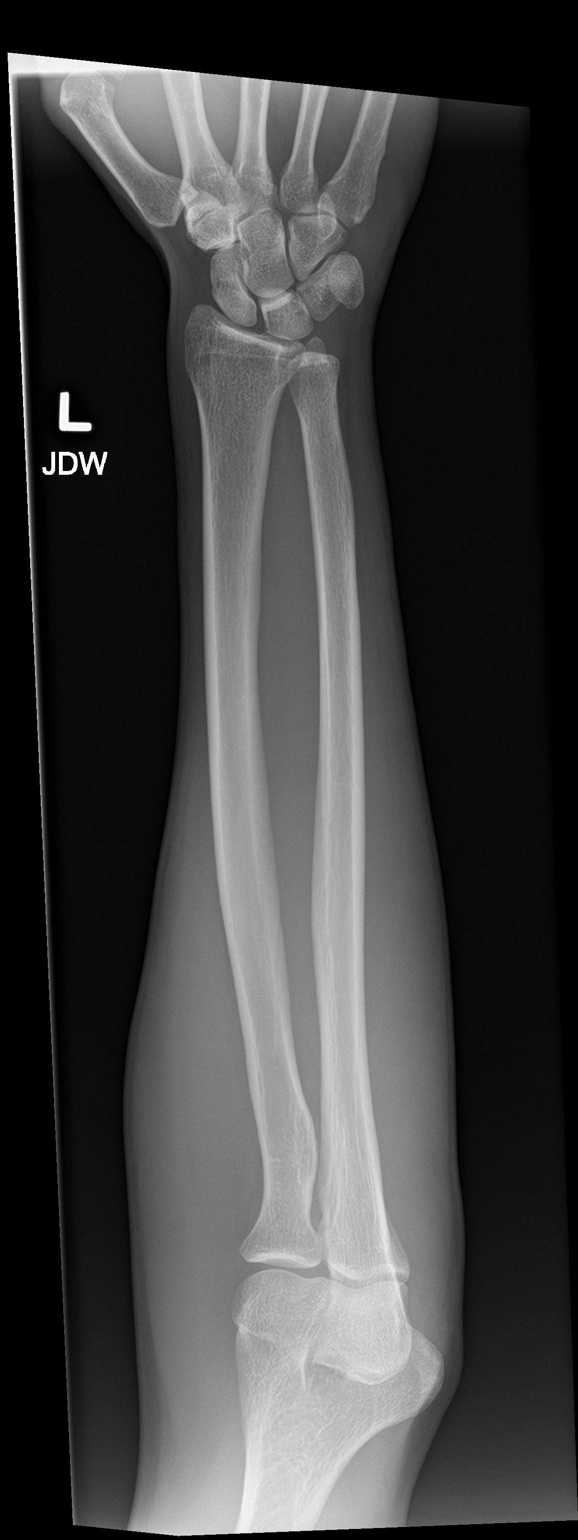

[forearm lat]
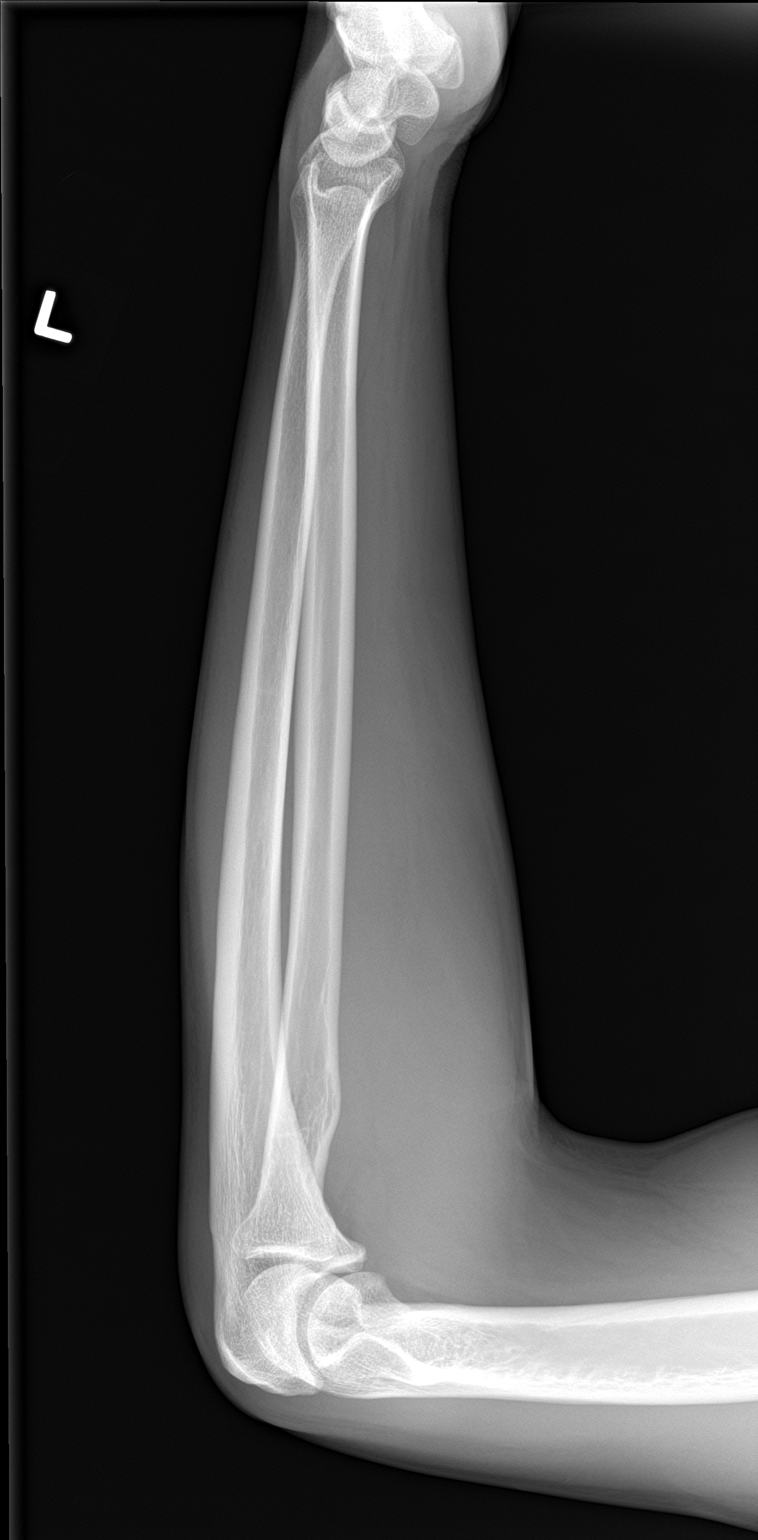

[2 of 2 positions shown; findings below may reference images not displayed]

FINDINGS: There is no evidence of fracture or other focal bone lesions. Soft
tissues are unremarkable.
IMPRESSION: Negative.

## 2020-11-08 ENCOUNTER — Emergency Department (HOSPITAL_COMMUNITY)
Admission: EM | Admit: 2020-11-08 | Discharge: 2020-11-09 | Disposition: A | Discharge status: Home / Self Care | Payer: Self-pay | Attending: Emergency Medicine | Admitting: Emergency Medicine

## 2020-11-08 DIAGNOSIS — K921 Melena: Secondary | ICD-10-CM

## 2020-11-08 DIAGNOSIS — R197 Diarrhea, unspecified: Secondary | ICD-10-CM | POA: Insufficient documentation

## 2020-11-08 DIAGNOSIS — G43909 Migraine, unspecified, not intractable, without status migrainosus: Secondary | ICD-10-CM | POA: Insufficient documentation

## 2020-11-08 DIAGNOSIS — R1084 Generalized abdominal pain: Secondary | ICD-10-CM

## 2020-11-08 NOTE — ED Provider Notes (Signed)
MSE was initiated and I personally evaluated the patient and placed orders (if any) at  11:51 PM on November 08, 2020.  History of UC after having issues for years. Now having constant diarrhea with bloody stool, fatigued, headaches. Has been prescribed steroids in the past but denies it helped. Here because of ongoing issues, no new symptoms.   Diffuse abdominal tenderness.     The patient appears stable so that the remainder of the MSE may be completed by another provider.   Elpidio Anis, PA-C 11/08/20 2355    Tilden Fossa, MD 11/09/20 [phone]

## 2020-11-09 ENCOUNTER — Other Ambulatory Visit: Payer: Self-pay

## 2020-11-09 ENCOUNTER — Encounter (HOSPITAL_COMMUNITY): Payer: Self-pay

## 2020-11-09 LAB — COMPREHENSIVE METABOLIC PANEL
ALT: 18 U/L (ref 0–44)
AST: 17 U/L (ref 15–41)
Albumin: 4.5 g/dL (ref 3.5–5.0)
Alkaline Phosphatase: 62 U/L (ref 38–126)
Anion gap: 7 (ref 5–15)
BUN: 16 mg/dL (ref 6–20)
CO2: 29 mmol/L (ref 22–32)
Calcium: 9 mg/dL (ref 8.9–10.3)
Chloride: 102 mmol/L (ref 98–111)
Creatinine, Ser: 0.87 mg/dL (ref 0.61–1.24)
GFR, Estimated: >60 mL/min (ref >60)
Glucose, Bld: 94 mg/dL (ref 70–99)
Potassium: 3.5 mmol/L (ref 3.5–5.1)
Sodium: 138 mmol/L (ref 135–145)
Total Bilirubin: 0.6 mg/dL (ref 0.3–1.2)
Total Protein: 7.2 g/dL (ref 6.5–8.1)

## 2020-11-09 LAB — CBC WITH DIFFERENTIAL/PLATELET
Abs Immature Granulocytes: 0.05 10*3/uL (ref 0.00–0.07)
Basophils Absolute: 0 10*3/uL (ref 0.0–0.1)
Basophils Relative: 1 %
Eosinophils Absolute: 0.2 10*3/uL (ref 0.0–0.5)
Eosinophils Relative: 4 %
HCT: 42 % (ref 39.0–52.0)
Hemoglobin: 15.3 g/dL (ref 13.0–17.0)
Immature Granulocytes: 1 %
Lymphocytes Relative: 44 %
Lymphs Abs: 2.9 10*3/uL (ref 0.7–4.0)
MCH: 31.9 pg (ref 26.0–34.0)
MCHC: 36.4 g/dL — ABNORMAL HIGH (ref 30.0–36.0)
MCV: 87.5 fL (ref 80.0–100.0)
Monocytes Absolute: 0.8 10*3/uL (ref 0.1–1.0)
Monocytes Relative: 12 %
Neutro Abs: 2.4 10*3/uL (ref 1.7–7.7)
Neutrophils Relative %: 38 %
Platelets: 225 10*3/uL (ref 150–400)
RBC: 4.8 MIL/uL (ref 4.22–5.81)
RDW: 12.2 % (ref 11.5–15.5)
WBC: 6.4 10*3/uL (ref 4.0–10.5)
nRBC: 0 % (ref 0.0–0.2)

## 2020-11-09 LAB — LIPASE, BLOOD: Lipase: 41 U/L (ref 11–51)

## 2020-11-09 NOTE — ED Provider Notes (Signed)
[facility]-EMERGENCY DEPT Provider Note   CSN: [id number] Arrival date & time: 11/08/20  2152     History Chief Complaint  Patient presents with   Abdominal Pain   Diarrhea   Blood In Stools   Migraine    Alexander Baird is a 30 y.o. male.  The history is provided by the patient and medical records.  Abdominal Pain Associated symptoms: diarrhea   Diarrhea Associated symptoms: abdominal pain   Migraine Associated symptoms include abdominal pain.  Alexander Baird is a 30 y.o. male who presents to the Emergency Department complaining of abdominal pain and hematochezia. He states that he has had long-standing issues with abdominal pain and hematochezia. Symptoms started around the age of 27 but were much more mild intermittent at that time. Over the last several months his symptoms have progressively worsened and now he is constant generalized abdominal pain. He also reports headache with light sensitivity and fatigue. He reports having daily diarrhea with blood mixed in. He reports up to five stools daily. Sometimes they are formed and sometimes they are more liquid in nature. He has an extensive family history of cancer. He works as a Agricultural consultant and was evaluated at hospital in [facility] was diagnosed with ulcerative colitis off of blood work and a CT scan on August 25 at this year. He completed the prednisone but had no significant change in his symptoms at that time.  Aunt with colon cancer, uncle with stomach cancer, mother with oral cancer.  He has no known medical problems.    History reviewed. No pertinent past medical history.  Patient Active Problem List   Diagnosis Date Noted   Left arm pain 08/30/2017    History reviewed. No pertinent surgical history.     Family History  Family history unknown: Yes    Social History   Tobacco Use   Smoking status: Never   Smokeless tobacco: Never  Substance Use Topics   Alcohol  use: No   Drug use: No    Home Medications Prior to Admission medications   Medication Sig Start Date End Date Taking? Authorizing Provider  diclofenac sodium (VOLTAREN) 1 % GEL Apply 2 g topically 4 (four) times daily. 08/27/17   Khatri, Hina, PA-C  doxycycline (VIBRAMYCIN) 100 MG capsule Take 1 capsule (100 mg total) by mouth 2 (two) times daily. 02/27/19   Wallis Bamberg, PA-C  emtricitabine-tenofovir (TRUVADA) 200-300 MG tablet Take 1 tablet by mouth daily. 10/23/17   Kuppelweiser, Cassie L, RPH-CPP  lidocaine (LIDODERM) 5 % Place 1 patch onto the skin daily. Remove & Discard patch within 12 hours or as directed by MD 09/02/17   Jeannie Fend, PA-C  meloxicam (MOBIC) 7.5 MG tablet Take one tab po qd with food prn pain once finished with sterapred taper 08/30/17   Cristie Hem, PA-C  methocarbamol (ROBAXIN) 500 MG tablet Take one tab po bid prn muscle spasm/pain 08/30/17   Cristie Hem, PA-C    Allergies    Patient has no known allergies.  Review of Systems   Review of Systems  Gastrointestinal:  Positive for abdominal pain and diarrhea.  All other systems reviewed and are negative.  Physical Exam Updated Vital Signs BP 128/82 (BP Location: Right Arm)   Pulse (!) 56   Temp 97.8 F (36.6 C) (Oral)   Resp 18   Ht 6\' 3"  (1.905 m)   Wt 73.9 kg   SpO2 100%   BMI 20.37 kg/m  Physical Exam Vitals and nursing note reviewed.  Constitutional:      Appearance: He is well-developed.  HENT:     Head: Normocephalic and atraumatic.  Cardiovascular:     Rate and Rhythm: Normal rate and regular rhythm.  Pulmonary:     Effort: Pulmonary effort is normal. No respiratory distress.  Abdominal:     Palpations: Abdomen is soft.     Tenderness: There is abdominal tenderness. There is no guarding or rebound.     Comments: Mild generalized abdominal tenderness  Musculoskeletal:        General: No swelling or tenderness.  Skin:    General: Skin is warm and dry.  Neurological:     Mental  Status: He is alert and oriented to person, place, and time.  Psychiatric:        Behavior: Behavior normal.    ED Results / Procedures / Treatments   Labs (all labs ordered are listed, but only abnormal results are displayed) Labs Reviewed  CBC WITH DIFFERENTIAL/PLATELET - Abnormal; Notable for the following components:      Result Value   MCHC 36.4 (*)    All other components within normal limits  COMPREHENSIVE METABOLIC PANEL  LIPASE, BLOOD  URINALYSIS, ROUTINE W REFLEX MICROSCOPIC    EKG None  Radiology No results found.  Procedures Procedures   Medications Ordered in ED Medications - No data to display  ED Course  I have reviewed the triage vital signs and the nursing notes.  Pertinent labs & imaging results that were available during my care of the patient were reviewed by me and considered in my medical decision making (see chart for details).    MDM Rules/Calculators/A&P                          Patient here for evaluation of progressive abdominal pain and hematochezia. He has mild tenderness without peritoneal findings on evaluation. CBC is within normal limits. Current examination and history is not consistent with intra-abdominal abscess. Discussed with patient that he needs to see gastroenterology for further workup of his progressive hematochezia and abdominal pain. He has concerns due to lack of health insurance - will place case management consult to assist for follow-up. Will place G.I. referral. Patient is requesting CT scan today in the emergency department. Feel that this will not assist in his workup and do not feel that it is currently indicated and there is more risk of harm than benefit at this point. He did have imaging performed on August 25 consistent with colitis.  Final Clinical Impression(s) / ED Diagnoses Final diagnoses:  Hematochezia  Generalized abdominal pain    Rx / DC Orders ED Discharge Orders     None        Tilden Fossa,  MD 11/09/20 [phone]

## 2020-11-09 NOTE — ED Triage Notes (Signed)
Pt reports being diagnosed with ulcerative colitis a couple of months ago. Pt reports frequent abdominal pain with diarrhea, blood in stools, and migraines. Pt states that no matter what he does he is still in pain. Pt is concerned about cancer with a family history.

## 2020-11-09 NOTE — ED Notes (Addendum)
Patient states that he wants a ct scan. Patient was explained to about why he was not getting a ct scan. Patient was upset and gave him the patient experience card.

## 2021-10-03 NOTE — ED Provider Notes (Signed)
 [facility] Emergency Department Provider Note   ED CLINICAL IMPRESSION:   Final diagnoses:  Abdominal pain, unspecified abdominal location (Primary)    MEDICAL DECISION MAKING:   Initial Assessment/Plan and ED Course:  This is a 31 y.o. male with a history of: There is no problem list on file for this patient.  Patient presenting with 2 years of bloody bowel movements with abdominal pain and intermittent nausea and vomiting.  Constellation of symptoms concerning for inflammatory bowel disease.  Will obtain basic laboratory evaluation   CBC without leukocytosis.  He does says elevated eosinophil's which could be seen in autoimmune related disease.  Urinalysis without signs of infection.  CMP unremarkable.  Lipase unremarkable.  Was able to collect ova parasite, GI pathogen panel and C. difficile.  Low concern for infectious etiology based off labs and story.  Patient Requesting antibiotics.  I told him I did not think this was infection given his duration.  I told him we will see what his stool studies show.  We went back and forth on his CT scan.  No new symptoms since last CT.  Patient ultimately needs a colonoscopy and endoscopy.  I did review the GI notes from October who recommended endoscopy.  The CT scan would not change those recommendations.  He now has anaphylaxis to contrast and a noncontrasted study would be further limited.  I do not think it would add much to the evaluation.  Final Assessment/Plan When considering the possible etiologies in combination with the clinical picture and work up, the symptoms/findings are most consistent with:    Abdominal pain, unspecified abdominal location - Ambulatory referral to General Surgery; Future Findings are very concerning for ulcerative colitis versus less likely Crohn's disease.  Patient was asking if this is a bacterial infection.  Given the duration, low likelihood.  Was evaluated by GI in October 2022.  Was recommending endoscopy  at that time.  Patient did not have the financial means to make it happen.  Considered a CT scan here but given his anaphylactic reaction to contrast, felt like doing a Noncon CT would be noncontributory.  Has had no new symptoms since his last CT.  States status gradual worsening.  Even if the CT was abnormal, his next step would be endoscopy.  We discussed it would not change immediate management.  Patient was concerned about the cost and to get the test. I provided him the charity care application and he will fill it out.  I placed a referral to our local general surgeon who does endoscopy here at [facility].  We will also send a message to him.  I discussed the findings of the medical work-up, my rationale behind the medical decision making process and my treatment plan with the patient. The patient understands and is comfortable with the plan of care. All questions were answered.     MEDICAL HISTORY:    CHIEF COMPLAINT: Abdominal Pain   HISTORY OF PRESENT ILLNESS:  Alexander Baird is a 31 y.o. male who presents to the [facility] ED today abdominal pain  HPI: Patient reports over the last 2 years she has had loose bowel movements with blood.  [place] it is maroon-colored.  Also having mucus.  Will have some nausea and vomiting.  Has had diffuse abdominal pain.  Has had recurrent ED visits but no diagnosis was given.  He was told he could have ulcerative colitis.  It was recommended for him to get a endoscopy done but they told him it  was $3000.  Patient was unable to afford that and did not follow back up.  This looks like it was in October.  Patient comes in today for ongoing symptoms.  Nothing acutely worse just gradually worsening over the last several months.  Patient states he takes 1 Imodium a day which helps his diarrhea  Patient is a Naval [employer].  Associated Symptoms:  Review of Systems  Gastrointestinal:  Positive for abdominal pain, blood in stool, diarrhea, melena, nausea and vomiting.      SURGICAL HISTORY:  has no past surgical history on file.  OUTPATIENT MEDICATIONS: Prior to Admission medications   Not on File    ALLERGIES: is allergic to iodinated contrast media.  FAMILY HISTORY: family history is not on file.  SOCIAL HISTORY: Tobacco use:  reports that he has never smoked. He has never used smokeless tobacco.  Alcohol use:  reports no history of alcohol use. Drug use:  reports no history of drug use.   PHYSICAL EXAM:   ED Vital Signs: Vitals:   10/02/21 2200  BP: 139/75  Pulse: 74  Resp: 16  Temp: 36.9 C (98.4 F)  TempSrc: Temporal  SpO2: 96%  Weight: 75.8 kg (167 lb)  Height: 190.5 cm (6' 3)    Physical Exam Vitals reviewed.  Constitutional:      General: He is not in acute distress.    Appearance: Normal appearance. He is not ill-appearing, toxic-appearing or diaphoretic.  HENT:     Head: Normocephalic and atraumatic.     Right Ear: External ear normal.     Left Ear: External ear normal.     Nose: Nose normal.  Pulmonary:     Effort: Pulmonary effort is normal.  Abdominal:     General: Abdomen is flat. There is no distension.     Palpations: Abdomen is soft. There is no mass.     Tenderness: There is generalized abdominal tenderness (mild). There is no guarding.  Skin:    Coloration: Skin is not pale.  Neurological:     Mental Status: He is alert.  Psychiatric:        Judgment: Judgment normal.      LABORATORY DATA:   Labs Reviewed  URINALYSIS WITH MICROSCOPY - Abnormal; Notable for the following components:      Result Value   Protein, UA Trace (*)    Bacteria, UA Few (*)    Mucus, UA Many (*)    All other components within normal limits  CBC W/ AUTO DIFF - Abnormal; Notable for the following components:   Absolute Eosinophils 0.8 (*)    All other components within normal limits  LIPASE - Normal  CBC W/ DIFFERENTIAL   Narrative:    The following orders were created for panel order CBC w/  Differential. Procedure                               Abnormality         Status                    ---------                               -----------         ------                    CBC  w/ [id number]]         Abnormal            Final result               Please view results for these tests on the individual orders.  COMPREHENSIVE METABOLIC PANEL  EXTRA TUBES   Narrative:    The following orders were created for panel order ED Extra Tubes. Procedure                               Abnormality         Status                    ---------                               -----------         ------                    LIGHT BLUE CITRATE EXTR.SABRASABRA[[id number]]                      Final result               Please view results for these tests on the individual orders.  LIGHT BLUE CITRATE EXTRA TUBE   Narrative:    Collected and received in lab.     Radiology   No results found.   PROCEDURES:  None   Pertinent labs & imaging results that were available during my care of the patient were reviewed by me and considered in my medical decision making. Labs and radiology studies included in my note may not constitute all ordered/reviewied labs during this encounter (see chart for details).       Bernardino Ozell Gaster, DO 10/03/21 0430

## 2022-02-07 NOTE — ED Notes (Signed)
 Discussed AVS with pt. Pt verbalized understanding. Pt left ed with all belongings.

## 2022-02-07 NOTE — ED Provider Notes (Signed)
 [facility] EMERGENCY DEPT  ED Provider Note History   Chief Complaint  Patient presents with  . Abdominal Pain   History of Present Illness HPI Alexander Baird is a 32yo male with no active medical problems, presenting to ED for abdominal pain. Patient reports months to years of chronic abdominal pain with intermittent nausea and bright red blood per rectum. He has been evaluated at other ED for this before and thought to have IBS versus UC versus Crohns. He was seen at GI in 10/2020 with plan for EGD/colonoscopy. Patient does not remember seeing a GI doctor and is unsure what happened to EGD/colonoscopy plan. He denies any acute worsening of pain. No fever, chills, chest pain, SOB. He does report some episodes of emesis but none recently. No recent blood in stool. He states he has about 2-3 BM a day that are usually loose. Will use imodium sometimes. He repots improvement of abdominal pain with BM. He does not take anything at home for pain as he is worried he will develop ulcers if he takes anything. He denies any hematuria, dysuria, testicular pain, penile pain, penile/testicular swelling. No concern for STDs.   History reviewed. No pertinent past medical history. History reviewed. No pertinent surgical history. History reviewed. No pertinent family history. Social History   Socioeconomic History  . Marital status: Single  Tobacco Use  . Smoking status: Some Days    Types: Cigarettes  . Smokeless tobacco: Never  Substance and Sexual Activity  . Alcohol use: Not Currently   Review of Systems  Constitutional:  Negative for chills and fever.  Respiratory:  Negative for shortness of breath.   Cardiovascular:  Negative for chest pain.  Gastrointestinal:  Positive for abdominal pain and nausea. Negative for blood in stool, constipation, diarrhea and vomiting.  Genitourinary:  Negative for dysuria, hematuria, penile pain, penile swelling, scrotal swelling and testicular pain.  Neurological:   Negative for syncope and light-headedness.    Physical Exam  BP 134/79 (BP Location: Left upper arm, Patient Position: Lying)   Pulse 73   Temp 36.1 C (97 F) (Tympanic)   Resp 18   SpO2 100%  Physical Exam Vitals and nursing note reviewed.  Constitutional:      Appearance: He is well-developed.  HENT:     Head: Normocephalic and atraumatic.  Cardiovascular:     Rate and Rhythm: Normal rate and regular rhythm.  Pulmonary:     Effort: Pulmonary effort is normal.     Breath sounds: Normal breath sounds.  Abdominal:     Palpations: Abdomen is soft.     Tenderness: There is abdominal tenderness in the left lower quadrant. There is no right CVA tenderness, left CVA tenderness, guarding or rebound.  Skin:    General: Skin is warm and dry.  Neurological:     Mental Status: He is alert and oriented to person, place, and time.     Procedures  Procedures   Medical Decision Making   Assessment and Plan:     Alexander Baird is a 32yo male with no active medical problems, presenting to ED for abdominal pain. Patient is in NAD and sleeping upon my evaluation. VSS. Physical exam as noted above. Will obtain labs and imaging for further evaluation. Patient deferred any pain medications.   Labs unremarkable.   CT renal: IMPRESSION: 1. No nephrolithiasis. 2. No CT etiology for patient's abdominal pain.   Discussed with patient that given he has no acute worsening of symptoms, no lab abnormalities, and overall  reassuring CT and exam that the next step would be PCP referral as we cannot refer to GI from the ED. We also talked about him trying to call PCP offices to see if he can be put on first available wait list. He verbalized understanding.   Patient discharged with strict precautions, PCP referral and supportive care recs.   Patient in stable condition without obvious distress or indication for additional imaging, treatment, consultation or admission.  Appears stable for discharge at  this time. Discussed follow up with PCP referral. Significant findings were reviewed and explained. Discussed discharge instructions as well as return precautions. Patient verbalized understanding, denies any questions or concerns that have not been addressed and verbalizes intentions of compliance.   Differential Diagnosis:    In addition to the differential diagnoses listed, other diagnoses were considered.      More of a suspicion for IBS versus ulcerative colitis versus Crohns. No elevation in inflammatory markers, though. Consideration for colitis, pancreatitis. Lower suspicion for appendicitis, surgical abdomen, bowel obstruction given history and reassuring w/u.  Medical Complexity:    New and requires workup.     Pertinent labs & imaging results that were available during my care of the patient were reviewed by me and considered in my medical decision making.     I reviewed previous medical records.     I independently visualized image(s), tracing(s), and/or specimen(s).   Summary Statement:     Patient workup reassuring. Patient referred to PCP for evaluation and likely GI referral. Patient discharged with strict precautions.         Medications Administered in the Emergency Department   lactated ringers bolus 1,000 mL (0 mLs Intravenous Stopped 02/06/22 2209)   ED Clinical Impression  1. Abdominal pain, unspecified abdominal location      ED Disposition  Discharge     Follow-up  [facility] Emergency Dept [address] [facility] [place]  [address] [phone] Go to  As needed, If symptoms worsen    Referrals  Procedures  . Ambulatory Referral to [facility]      Alexander Baird, [place] 02/07/22 [phone]

## 2023-09-26 ENCOUNTER — Other Ambulatory Visit: Payer: Self-pay

## 2023-09-26 ENCOUNTER — Emergency Department: Admission: EM | Admit: 2023-09-26 | Discharge: 2023-09-26 | Disposition: A | Discharge status: Home / Self Care

## 2023-09-26 ENCOUNTER — Encounter: Payer: Self-pay | Admitting: Emergency Medicine

## 2023-09-26 ENCOUNTER — Emergency Department

## 2023-09-26 DIAGNOSIS — J9801 Acute bronchospasm: Secondary | ICD-10-CM | POA: Diagnosis not present

## 2023-09-26 DIAGNOSIS — Z77098 Contact with and (suspected) exposure to other hazardous, chiefly nonmedicinal, chemicals: Secondary | ICD-10-CM | POA: Insufficient documentation

## 2023-09-26 DIAGNOSIS — R0602 Shortness of breath: Secondary | ICD-10-CM | POA: Diagnosis present

## 2023-09-26 LAB — BASIC METABOLIC PANEL WITH GFR
Anion gap: 12 (ref 5–15)
BUN: 9 mg/dL (ref 6–20)
CO2: 21 mmol/L — ABNORMAL LOW (ref 22–32)
Calcium: 9.2 mg/dL (ref 8.9–10.3)
Chloride: 107 mmol/L (ref 98–111)
Creatinine, Ser: 0.99 mg/dL (ref 0.61–1.24)
GFR, Estimated: >60 mL/min (ref >60)
Glucose, Bld: 160 mg/dL — ABNORMAL HIGH (ref 70–99)
Potassium: 3.8 mmol/L (ref 3.5–5.1)
Sodium: 140 mmol/L (ref 135–145)

## 2023-09-26 LAB — CBC
HCT: 42.4 % (ref 39.0–52.0)
Hemoglobin: 15.5 g/dL (ref 13.0–17.0)
MCH: 31.6 pg (ref 26.0–34.0)
MCHC: 36.6 g/dL — ABNORMAL HIGH (ref 30.0–36.0)
MCV: 86.5 fL (ref 80.0–100.0)
Platelets: 253 K/uL (ref 150–400)
RBC: 4.9 MIL/uL (ref 4.22–5.81)
RDW: 12.3 % (ref 11.5–15.5)
WBC: 6.5 K/uL (ref 4.0–10.5)
nRBC: 0 % (ref 0.0–0.2)

## 2023-09-26 MED ORDER — ALBUTEROL SULFATE HFA 108 (90 BASE) MCG/ACT IN AERS
2.0000 | INHALATION_SPRAY | Freq: Once | RESPIRATORY_TRACT | Status: DC
  Filled 2023-09-26: qty 6.7

## 2023-09-26 MED ORDER — ALBUTEROL SULFATE (2.5 MG/3ML) 0.083% IN NEBU: 2.5000 mg | INHALATION_SOLUTION | Freq: Once | RESPIRATORY_TRACT | Status: DC

## 2023-09-26 MED ORDER — ALBUTEROL SULFATE HFA 108 (90 BASE) MCG/ACT IN AERS: 2.0000 | INHALATION_SPRAY | Freq: Four times a day (QID) | RESPIRATORY_TRACT | 2 refills | Status: AC | PRN

## 2023-09-26 NOTE — ED Notes (Signed)
 Awaiting inhaler from main pharmacy
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
## 2023-10-01 ENCOUNTER — Encounter: Payer: Self-pay | Admitting: Family Medicine

## 2023-10-01 ENCOUNTER — Ambulatory Visit (INDEPENDENT_AMBULATORY_CARE_PROVIDER_SITE_OTHER): Admitting: Family Medicine

## 2023-10-01 ENCOUNTER — Ambulatory Visit: Payer: Self-pay

## 2023-10-01 VITALS — BP 129/78 | HR 85 | Temp 97.5°F | Resp 18 | Ht 76.0 in | Wt 205.0 lb

## 2023-10-01 DIAGNOSIS — J680 Bronchitis and pneumonitis due to chemicals, gases, fumes and vapors: Secondary | ICD-10-CM

## 2023-10-01 DIAGNOSIS — J984 Other disorders of lung: Secondary | ICD-10-CM | POA: Diagnosis not present

## 2023-10-01 MED ORDER — PREDNISONE 10 MG (21) PO TBPK: ORAL_TABLET | ORAL | 0 refills | Status: AC

## 2023-10-01 MED ORDER — DOXYCYCLINE HYCLATE 100 MG PO CAPS: 100.0000 mg | ORAL_CAPSULE | Freq: Two times a day (BID) | ORAL | 0 refills | Status: AC

## 2023-10-01 NOTE — Telephone Encounter (Signed)
 Duplicate, see nurse triage encounter this afternoon. Patient spoke with RN Ariel. This encounter was created in error - please disregard.
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
## 2023-10-03 DIAGNOSIS — J984 Other disorders of lung: Secondary | ICD-10-CM | POA: Insufficient documentation

## 2023-10-03 NOTE — Assessment & Plan Note (Signed)
 Doxycycline  and prednisone  taper.  Please take medications as directed.  Please take with food.

## 2024-02-26 ENCOUNTER — Other Ambulatory Visit: Payer: Self-pay

## 2024-02-26 ENCOUNTER — Encounter: Payer: Self-pay | Admitting: Emergency Medicine

## 2024-02-26 DIAGNOSIS — Z716 Tobacco abuse counseling: Secondary | ICD-10-CM | POA: Diagnosis not present

## 2024-02-26 DIAGNOSIS — R1032 Left lower quadrant pain: Secondary | ICD-10-CM | POA: Insufficient documentation

## 2024-02-26 DIAGNOSIS — R112 Nausea with vomiting, unspecified: Secondary | ICD-10-CM | POA: Diagnosis not present

## 2024-02-26 DIAGNOSIS — F17210 Nicotine dependence, cigarettes, uncomplicated: Secondary | ICD-10-CM | POA: Insufficient documentation

## 2024-02-26 DIAGNOSIS — R197 Diarrhea, unspecified: Secondary | ICD-10-CM | POA: Insufficient documentation

## 2024-02-26 NOTE — ED Triage Notes (Signed)
 Pt to triage via w/c with no distress noted; c/o left lower abd/side pain ongoing for several yrs accomp by diarrhea, increased urination and vomiting; st seen for same but with negative findings; took immodium PTA

## 2024-02-27 ENCOUNTER — Emergency Department
Admission: EM | Admit: 2024-02-27 | Discharge: 2024-02-27 | Disposition: A | Discharge status: Home / Self Care | Attending: Emergency Medicine | Admitting: Emergency Medicine

## 2024-02-27 DIAGNOSIS — R1032 Left lower quadrant pain: Secondary | ICD-10-CM

## 2024-02-27 DIAGNOSIS — R112 Nausea with vomiting, unspecified: Secondary | ICD-10-CM

## 2024-02-27 DIAGNOSIS — Z716 Tobacco abuse counseling: Secondary | ICD-10-CM

## 2024-02-27 LAB — COMPREHENSIVE METABOLIC PANEL WITH GFR
ALT: 30 U/L (ref 0–44)
AST: 25 U/L (ref 15–41)
Albumin: 4.7 g/dL (ref 3.5–5.0)
Alkaline Phosphatase: 77 U/L (ref 38–126)
Anion gap: 11 (ref 5–15)
BUN: 10 mg/dL (ref 6–20)
CO2: 25 mmol/L (ref 22–32)
Calcium: 8.9 mg/dL (ref 8.9–10.3)
Chloride: 104 mmol/L (ref 98–111)
Creatinine, Ser: 1.01 mg/dL (ref 0.61–1.24)
GFR, Estimated: >60 mL/min
Glucose, Bld: 95 mg/dL (ref 70–99)
Potassium: 3.9 mmol/L (ref 3.5–5.1)
Sodium: 140 mmol/L (ref 135–145)
Total Bilirubin: 0.7 mg/dL (ref 0.0–1.2)
Total Protein: 7.4 g/dL (ref 6.5–8.1)

## 2024-02-27 LAB — CBC WITH DIFFERENTIAL/PLATELET
Abs Immature Granulocytes: 0.01 10*3/uL (ref 0.00–0.07)
Basophils Absolute: 0 10*3/uL (ref 0.0–0.1)
Basophils Relative: 0 %
Eosinophils Absolute: 0.2 10*3/uL (ref 0.0–0.5)
Eosinophils Relative: 4 %
HCT: 40.5 % (ref 39.0–52.0)
Hemoglobin: 14.4 g/dL (ref 13.0–17.0)
Immature Granulocytes: 0 %
Lymphocytes Relative: 47 %
Lymphs Abs: 2.8 10*3/uL (ref 0.7–4.0)
MCH: 31 pg (ref 26.0–34.0)
MCHC: 35.6 g/dL (ref 30.0–36.0)
MCV: 87.3 fL (ref 80.0–100.0)
Monocytes Absolute: 0.8 10*3/uL (ref 0.1–1.0)
Monocytes Relative: 14 %
Neutro Abs: 2.1 10*3/uL (ref 1.7–7.7)
Neutrophils Relative %: 35 %
Platelets: 238 10*3/uL (ref 150–400)
RBC: 4.64 MIL/uL (ref 4.22–5.81)
RDW: 12.3 % (ref 11.5–15.5)
WBC: 6 10*3/uL (ref 4.0–10.5)
nRBC: 0 % (ref 0.0–0.2)

## 2024-02-27 LAB — URINALYSIS, ROUTINE W REFLEX MICROSCOPIC
Bilirubin Urine: NEGATIVE
Glucose, UA: NEGATIVE mg/dL
Hgb urine dipstick: NEGATIVE
Ketones, ur: NEGATIVE mg/dL
Leukocytes,Ua: NEGATIVE
Nitrite: NEGATIVE
Protein, ur: NEGATIVE mg/dL
Specific Gravity, Urine: 1.004 — ABNORMAL LOW (ref 1.005–1.030)
pH: 6 (ref 5.0–8.0)

## 2024-02-27 LAB — LIPASE, BLOOD: Lipase: 49 U/L (ref 11–51)

## 2024-02-27 MED ORDER — ONDANSETRON 4 MG PO TBDP: 4.0000 mg | ORAL_TABLET | Freq: Three times a day (TID) | ORAL | 0 refills | Status: AC | PRN

## 2024-02-27 MED ORDER — NICOTINE POLACRILEX 4 MG MT LOZG: 4.0000 mg | LOZENGE | OROMUCOSAL | 0 refills | Status: AC | PRN

## 2024-02-27 MED ORDER — NICOTINE 7 MG/24HR TD PT24: 7.0000 mg | MEDICATED_PATCH | Freq: Every day | TRANSDERMAL | 0 refills | Status: AC

## 2024-02-27 MED ORDER — ONDANSETRON 8 MG PO TBDP
8.0000 mg | ORAL_TABLET | Freq: Once | ORAL | Status: AC
  Administered 2024-02-27: 8 mg via ORAL
  Filled 2024-02-27: qty 1

## 2024-02-27 NOTE — ED Provider Notes (Signed)
 "  [facility] Provider Note    Event Date/Time   First MD Initiated Contact with Patient 02/27/24 0125     (approximate)   History   Abdominal Pain   HPI  Alexander Baird is a 34 y.o. male   Past medical history of somewhat chronic nausea vomiting diarrhea, crampy abdominal pain, bloating abdominal pain, over the past 2 years and has had multiple hospital evaluations including multiple CT scans with unrevealing workups.  Has been asked to see GI but has never seen GI yet.  Ongoing symptoms, unchanged.  Denies GI bleeding.  No dysuria.  No testicular pain or swelling.  No penile discharge.  Wonders if he feels a bump on the left side of his abdominal wall skin.   Independent Historian contributed to assessment above: His father is at bedside to corroborate information above  External Medical Documents Reviewed: Previous outpatient notes      Physical Exam   Triage Vital Signs: ED Triage Vitals  Encounter Vitals Group     BP 02/26/24 2348 (!) 136/96     Girls Systolic BP Percentile --      Girls Diastolic BP Percentile --      Boys Systolic BP Percentile --      Boys Diastolic BP Percentile --      Pulse Rate 02/26/24 2348 71     Resp 02/26/24 2348 20     Temp 02/26/24 2348 97.8 F (36.6 C)     Temp Source 02/26/24 2348 Oral     SpO2 02/26/24 2348 100 %     Weight 02/26/24 2349 200 lb (90.7 kg)     Height 02/26/24 2349 6' 3 (1.905 m)     Head Circumference --      Peak Flow --      Pain Score 02/26/24 2348 10     Pain Loc --      Pain Education --      Exclude from Growth Chart --     Most recent vital signs: Vitals:   02/26/24 2348 02/27/24 0315  BP: (!) 136/96 128/84  Pulse: 71 74  Resp: 20 16  Temp: 97.8 F (36.6 C) 97.7 F (36.5 C)  SpO2: 100% 100%    General: Awake, no distress.  CV:  Good peripheral perfusion.  Resp:  Normal effort.  Abd:  No distention.  Other:  Nontender nonfocal nonperitoneal abdominal exam.  No  discernible bump that the patient notes on the left lower side of his abdomen.  Certainly no hernias.  No masses.  Testicles and scrotum look normal without any tenderness to palpation, swelling or redness.   ED Results / Procedures / Treatments   Labs (all labs ordered are listed, but only abnormal results are displayed) Labs Reviewed  URINALYSIS, ROUTINE W REFLEX MICROSCOPIC - Abnormal; Notable for the following components:      Result Value   Color, Urine STRAW (*)    APPearance CLEAR (*)    Specific Gravity, Urine 1.004 (*)    All other components within normal limits  CBC WITH DIFFERENTIAL/PLATELET  COMPREHENSIVE METABOLIC PANEL WITH GFR  LIPASE, BLOOD     I ordered and reviewed the above labs they are notable for cell counts electrolytes urinalysis unremarkable.  PROCEDURES:  Critical Care performed: No  Procedures   MEDICATIONS ORDERED IN ED: Medications  ondansetron  (ZOFRAN -ODT) disintegrating tablet 8 mg (8 mg Oral Given 02/27/24 9687)    IMPRESSION / MDM / ASSESSMENT AND PLAN / ED  COURSE  I reviewed the triage vital signs and the nursing notes.                                Patient's presentation is most consistent with acute presentation with potential threat to life or bodily function.  Differential diagnosis includes, but is not limited to, IBS, IBD, dehydration, viral gastroenteritis, considered but less likely acute abdominal pathology surgical abdominal pathologies testicular torsion   MDM:    Chronic abdominal bloating/diarrhea/vomiting/crampy abdominal pain unchanged today.  Very benign abdominal exam and lab analysis unremarkable, considered but doubt surgical abdominal pathologies.  Considered advanced imaging but given his report of multiple CT scans during the past 2 years with identical symptoms that were unrevealing I doubt another CT scan today will be helpful he and his father are in agreement.  We do think that he needs to see a GI doctor and  I put in a referral.  I have also ordered some Zofran  for him as a prescription to take as needed for nausea and vomiting.  I considered hospitalization for admission or observation however given the chronicity of symptoms and benign evaluation today I think outpatient follow-up with GI is most appropriate.  Return precautions were given.  He does smoke cigarettes and wants to quit.  I spent 5 minutes counseling this patient on smoking cessation.  We spoke about the patient's current tobacco use, impact of smoking, assessed willingness to quit, methods for cessation including medical management and nicotine  replacement therapy (which I prescribed to the patient) and advised follow-up with primary doctor to continue to address smoking cessation.       FINAL CLINICAL IMPRESSION(S) / ED DIAGNOSES   Final diagnoses:  Left lower quadrant abdominal pain  Nausea vomiting and diarrhea  Encounter for smoking cessation counseling     Rx / DC Orders   ED Discharge Orders          Ordered    ondansetron  (ZOFRAN -ODT) 4 MG disintegrating tablet  Every 8 hours PRN        02/27/24 0250    Ambulatory referral to Gastroenterology        02/27/24 0250    nicotine  (NICODERM CQ  - DOSED IN MG/24 HR) 7 mg/24hr patch  Daily        02/27/24 0252    nicotine  polacrilex (NICOTINE  MINI) 4 MG lozenge  As needed        02/27/24 0252    Ambulatory Referral to Primary Care (Establish Care)        02/27/24 0257             Note:  This document was prepared using Dragon voice recognition software and may include unintentional dictation errors.    Cyrena Mylar, MD 02/27/24 [id number]  "

## 2024-02-27 NOTE — Discharge Instructions (Signed)
 Take zofran for nausea.  Drink plenty of fluids to stay well-hydrated.  Find Pedialyte or similar electrolyte rehydration formulas at your local pharmacy.   Thank you for choosing [place] for your health care today!  Please see your primary doctor this week for a follow up appointment.   If you have any new, worsening, or unexpected symptoms call your doctor right away or come back to the emergency department for reevaluation.  It was my pleasure to care for you today.   Daneil Dan Modesto Charon, MD

## 2024-03-05 ENCOUNTER — Ambulatory Visit
